# Patient Record
Sex: Female | Born: 1940
Health system: Southern US, Community
[De-identification: ages and names within clinical notes are randomized; demographics above are authoritative.]

## PROBLEM LIST (undated history)

## (undated) ENCOUNTER — Ambulatory Visit: Payer: PPO

## (undated) DIAGNOSIS — K219 Gastro-esophageal reflux disease without esophagitis: Secondary | ICD-10-CM

## (undated) DIAGNOSIS — IMO0001 Reserved for inherently not codable concepts without codable children: Secondary | ICD-10-CM

## (undated) DIAGNOSIS — R079 Chest pain, unspecified: Secondary | ICD-10-CM

## (undated) HISTORY — PX: NO PAST SURGERIES: SHX2092

## (undated) HISTORY — PX: OTHER SURGICAL HISTORY: SHX169

---

## 2001-05-16 ENCOUNTER — Other Ambulatory Visit: Admission: RE | Admit: 2001-05-16 | Discharge: 2001-05-16 | Payer: Self-pay | Admitting: General Surgery

## 2002-03-04 ENCOUNTER — Other Ambulatory Visit: Admission: RE | Admit: 2002-03-04 | Discharge: 2002-03-04 | Payer: Self-pay | Admitting: General Surgery

## 2002-03-20 ENCOUNTER — Ambulatory Visit (HOSPITAL_COMMUNITY): Admission: RE | Admit: 2002-03-20 | Discharge: 2002-03-20 | Payer: Self-pay | Admitting: General Surgery

## 2002-03-20 ENCOUNTER — Encounter: Payer: Self-pay | Admitting: General Surgery

## 2012-10-03 ENCOUNTER — Inpatient Hospital Stay (HOSPITAL_COMMUNITY)
Admission: EM | Admit: 2012-10-03 | Discharge: 2012-10-14 | DRG: 234 | Disposition: A | Discharge status: Home / Self Care | Payer: Medicare Other | Attending: Cardiothoracic Surgery | Admitting: Cardiothoracic Surgery

## 2012-10-03 ENCOUNTER — Emergency Department (HOSPITAL_COMMUNITY): Payer: Medicare Other

## 2012-10-03 ENCOUNTER — Encounter (HOSPITAL_COMMUNITY): Payer: Self-pay | Admitting: Vascular Surgery

## 2012-10-03 DIAGNOSIS — Z951 Presence of aortocoronary bypass graft: Secondary | ICD-10-CM

## 2012-10-03 DIAGNOSIS — I2510 Atherosclerotic heart disease of native coronary artery without angina pectoris: Secondary | ICD-10-CM

## 2012-10-03 DIAGNOSIS — R079 Chest pain, unspecified: Secondary | ICD-10-CM

## 2012-10-03 DIAGNOSIS — I10 Essential (primary) hypertension: Secondary | ICD-10-CM

## 2012-10-03 DIAGNOSIS — E876 Hypokalemia: Secondary | ICD-10-CM

## 2012-10-03 DIAGNOSIS — K5900 Constipation, unspecified: Secondary | ICD-10-CM | POA: Diagnosis not present

## 2012-10-03 DIAGNOSIS — I214 Non-ST elevation (NSTEMI) myocardial infarction: Principal | ICD-10-CM | POA: Diagnosis present

## 2012-10-03 DIAGNOSIS — I509 Heart failure, unspecified: Secondary | ICD-10-CM

## 2012-10-03 DIAGNOSIS — D62 Acute posthemorrhagic anemia: Secondary | ICD-10-CM | POA: Diagnosis not present

## 2012-10-03 DIAGNOSIS — E782 Mixed hyperlipidemia: Secondary | ICD-10-CM

## 2012-10-03 DIAGNOSIS — D696 Thrombocytopenia, unspecified: Secondary | ICD-10-CM | POA: Diagnosis not present

## 2012-10-03 HISTORY — DX: Reserved for inherently not codable concepts without codable children: IMO0001

## 2012-10-03 HISTORY — DX: Chest pain, unspecified: R07.9

## 2012-10-03 HISTORY — DX: Gastro-esophageal reflux disease without esophagitis: K21.9

## 2012-10-03 LAB — POCT I-STAT TROPONIN I: Troponin i, poc: 0.77 ng/mL (ref 0.00–0.08)

## 2012-10-03 MED ORDER — ASPIRIN 81 MG PO CHEW
324.0000 mg | CHEWABLE_TABLET | Freq: Once | ORAL | Status: AC
  Administered 2012-10-03: 324 mg via ORAL
  Filled 2012-10-03: qty 4

## 2012-10-03 NOTE — ED Notes (Signed)
Pt reports to  The ED via [facility]. Pts PCP was concerned because she was sitting down last night and she began having pain in her slight dull neck and jaw. Also reports a dull HA. Pt did have some dull aching in her chest following eating junk food at a party. She reports that last night she took her BP and it was high. 12 lead was unremarkable per EMS. Pt denies any pain at this time. Pt denies any associated symptoms with the episode of N/V, SOB, diaphoresis, or weakness.

## 2012-10-03 NOTE — H&P (Signed)
CARDIOLOGY ADMISSION NOTE  Patient ID: Abigail Solis MRN: [id number] DOB/AGE: 11-12-1940 72 y.o.  Admit date: 10/03/2012 Primary Physician   Dr. Catalina Pizza Primary Cardiologist   None Chief Complaint    Chest pain  HPI:  The patient has no prior cardiac history.  She reports that Tuesday night she had a dull headache. She subsequently developed some jaw and neck mild numbness and discomfort. She did develop some old chest discomfort as well. This started around 5 PM.  She thought it lasted for about 5 hours. Her husband took her blood pressure and noted it to be elevated which they thought was unusual. She was anxious and somewhat uncomfortable in a vague way and didn't sleep the rest of the night. However, she didn't have any recurrent chest discomfort. By the time she saw Dr. Margo Aye at 2:30 in the afternoon on Wednesday she had been completely pain-free and feeling well all day. However, he did cycle cardiac enzymes and a troponin came back slightly elevated. She was thus told to come to the emergency room. Here again she is pain-free. She does have an EKG with diffuse T wave inversions. Her blood pressure remains elevated. Her first point of care troponin was 0.77. She otherwise says she felt well. She goes up and down stairs routinely. She's not been exercising recently. However, with her activity she's not been getting any chest pressure, neck or arm discomfort. She's not noticed any palpitations, presyncope or syncope. She's had no shortness of breath, PND or orthopnea.   Past Medical History  Diagnosis Date  . Reflux     Past Surgical History  Procedure Date  . None     Allergies  Allergen Reactions  . Nsaids Hives and Itching    blisters   No current facility-administered medications on file prior to encounter.   Current Outpatient Prescriptions on File Prior to Encounter  Medication Sig Dispense Refill  . omeprazole (PRILOSEC) 20 MG capsule Take 20 mg by mouth daily.        History   Social History  . Marital Status: Married    Spouse Name: N/A    Number of Children: 3  . Years of Education: N/A   Occupational History  . Not on file.   Social History Main Topics  . Smoking status: Never Smoker   . Smokeless tobacco: Never Used  . Alcohol Use: No  . Drug Use: No  . Sexually Active:    Other Topics Concern  . Not on file   Social History Narrative   Lives at home with husband    Family History  Problem Relation Age of Onset  . Sudden death Father 75    ROS:  As stated in the HPI and negative for all other systems.  Physical Exam: Blood pressure 160/88, pulse 76, temperature 98.6 F (37 C), temperature source Oral, resp. rate 13, height 5' (1.524 m), weight 130 lb 8 oz (59.194 kg), SpO2 94.00%.  GENERAL:  Well appearing HEENT:  Pupils equal round and reactive, fundi not visualized, oral mucosa unremarkable NECK:  No jugular venous distention, waveform within normal limits, carotid upstroke brisk and symmetric, no bruits, no thyromegaly LYMPHATICS:  No cervical, inguinal adenopathy LUNGS:  Clear to auscultation bilaterally BACK:  No CVA tenderness CHEST:  Unremarkable HEART:  PMI not displaced or sustained,S1 and S2 within normal limits, no S3, no S4, no clicks, no rubs, no murmurs ABD:  Flat, positive bowel sounds normal in frequency in pitch, no  bruits, no rebound, no guarding, no midline pulsatile mass, no hepatomegaly, no splenomegaly EXT:  2 plus pulses throughout, no edema, no cyanosis no clubbing SKIN:  No rashes no nodules NEURO:  Cranial nerves II through XII grossly intact, motor grossly intact throughout PSYCH:  Cognitively intact, oriented to person place and time  Labs: Lab Results  Component Value Date   BUN 14 10/04/2012   Lab Results  Component Value Date   CREATININE 0.56 10/04/2012   Lab Results  Component Value Date   NA 139 10/04/2012   No results found for this basename: CKTOTAL,  CKMB,  CKMBINDEX,  TROPONINI    Lab Results  Component Value Date   WBC 4.9 10/04/2012   Lab Results  Component Value Date   CHOL 195 10/04/2012   Lab Results  Component Value Date   ALT 14 10/04/2012    Radiology:  CXR:  No acute disease  EKG:  Normal sinus rhythm, rate 79, axis within normal limits, poor anterior R wave progression, diffuse inferior and anterolateral T-wave inversions consistent with ischemia, prolonged QT, no acute ST changes.  ASSESSMENT AND PLAN:    Chest pain - Is an atypical presentation but she has a markedly abnormal EKG with elevated troponins. I don't suspect dissection or pulmonary emboli. We need to exclude obstructive coronary disease. I will admit her and start heparin and beta blockers. She will continue on aspirin which she received. I have discussed at length with her cardiac catheterization. The patient understands that risks included but are not limited to stroke (1 in 1000), death (1 in 1000), kidney failure [usually temporary] (1 in 500), bleeding (1 in 200), allergic reaction [possibly serious] (1 in 200).  The patient understands and agrees to proceed.  I will set this up in the morning. I will order an echocardiogram which I would like to be done first.  Hypertension - This is unusual. I will start her on beta blocker as above. Further management will be based on her response to this.   SignedRollene Rotunda 10/04/2012, 5:00 AM

## 2012-10-03 NOTE — ED Notes (Signed)
Critical labs reported to Dr.Pickering and Dr.Bonk

## 2012-10-04 ENCOUNTER — Other Ambulatory Visit: Payer: Self-pay | Admitting: *Deleted

## 2012-10-04 ENCOUNTER — Encounter (HOSPITAL_COMMUNITY)
Admission: EM | Disposition: A | Discharge status: Home / Self Care | Payer: Self-pay | Source: Home / Self Care | Attending: Cardiovascular Disease

## 2012-10-04 ENCOUNTER — Encounter (HOSPITAL_COMMUNITY): Payer: Self-pay | Admitting: Cardiology

## 2012-10-04 DIAGNOSIS — E782 Mixed hyperlipidemia: Secondary | ICD-10-CM

## 2012-10-04 DIAGNOSIS — I2510 Atherosclerotic heart disease of native coronary artery without angina pectoris: Secondary | ICD-10-CM

## 2012-10-04 DIAGNOSIS — I214 Non-ST elevation (NSTEMI) myocardial infarction: Secondary | ICD-10-CM | POA: Diagnosis present

## 2012-10-04 DIAGNOSIS — I10 Essential (primary) hypertension: Secondary | ICD-10-CM

## 2012-10-04 DIAGNOSIS — I509 Heart failure, unspecified: Secondary | ICD-10-CM

## 2012-10-04 DIAGNOSIS — I369 Nonrheumatic tricuspid valve disorder, unspecified: Secondary | ICD-10-CM

## 2012-10-04 HISTORY — PX: LEFT HEART CATHETERIZATION WITH CORONARY ANGIOGRAM: SHX5451

## 2012-10-04 LAB — MAGNESIUM: Magnesium: 2.1 mg/dL (ref 1.5–2.5)

## 2012-10-04 LAB — COMPREHENSIVE METABOLIC PANEL
AST: 23 U/L (ref 0–37)
Albumin: 3.4 g/dL — ABNORMAL LOW (ref 3.5–5.2)
Calcium: 9.3 mg/dL (ref 8.4–10.5)
Creatinine, Ser: 0.56 mg/dL (ref 0.50–1.10)
GFR calc non Af Amer: >90 mL/min (ref >90)

## 2012-10-04 LAB — PRO B NATRIURETIC PEPTIDE: Pro B Natriuretic peptide (BNP): 898.6 pg/mL — ABNORMAL HIGH (ref 0–125)

## 2012-10-04 LAB — LIPID PANEL
LDL Cholesterol: 126 mg/dL — ABNORMAL HIGH (ref 0–99)
Total CHOL/HDL Ratio: 3.5 RATIO

## 2012-10-04 LAB — CBC WITH DIFFERENTIAL/PLATELET
Basophils Absolute: 0 10*3/uL (ref 0.0–0.1)
Eosinophils Absolute: 0 10*3/uL (ref 0.0–0.7)
Eosinophils Relative: 1 % (ref 0–5)
MCH: 28 pg (ref 26.0–34.0)
MCHC: 31.9 g/dL (ref 30.0–36.0)
MCV: 87.8 fL (ref 78.0–100.0)
Platelets: 275 10*3/uL (ref 150–400)
RDW: 13.2 % (ref 11.5–15.5)

## 2012-10-04 LAB — HEMOGLOBIN A1C: Hgb A1c MFr Bld: 6 % — ABNORMAL HIGH (ref <5.7)

## 2012-10-04 LAB — TSH: TSH: 2.214 u[IU]/mL (ref 0.350–4.500)

## 2012-10-04 LAB — PROTIME-INR
INR: 0.99 (ref 0.00–1.49)
Prothrombin Time: 13 seconds (ref 11.6–15.2)

## 2012-10-04 SURGERY — LEFT HEART CATHETERIZATION WITH CORONARY ANGIOGRAM
Anesthesia: LOCAL

## 2012-10-04 MED ORDER — PANTOPRAZOLE SODIUM 40 MG PO TBEC
40.0000 mg | DELAYED_RELEASE_TABLET | Freq: Every day | ORAL | Status: DC
  Administered 2012-10-04 – 2012-10-08 (×5): 40 mg via ORAL
  Filled 2012-10-04 (×5): qty 1

## 2012-10-04 MED ORDER — SODIUM CHLORIDE 0.9 % IV SOLN: 250.0000 mL | INTRAVENOUS | Status: DC | PRN

## 2012-10-04 MED ORDER — NITROGLYCERIN 0.4 MG SL SUBL: 0.4000 mg | SUBLINGUAL_TABLET | SUBLINGUAL | Status: DC | PRN

## 2012-10-04 MED ORDER — SODIUM CHLORIDE 0.9 % IJ SOLN: 3.0000 mL | Freq: Two times a day (BID) | INTRAMUSCULAR | Status: DC

## 2012-10-04 MED ORDER — ASPIRIN EC 81 MG PO TBEC
81.0000 mg | DELAYED_RELEASE_TABLET | Freq: Every day | ORAL | Status: DC
  Administered 2012-10-05 – 2012-10-10 (×5): 81 mg via ORAL
  Filled 2012-10-04 (×7): qty 1

## 2012-10-04 MED ORDER — ONDANSETRON HCL 4 MG/2ML IJ SOLN: 4.0000 mg | Freq: Four times a day (QID) | INTRAMUSCULAR | Status: DC | PRN

## 2012-10-04 MED ORDER — HEPARIN BOLUS VIA INFUSION
2000.0000 [IU] | Freq: Once | INTRAVENOUS | Status: AC
  Administered 2012-10-04: 2000 [IU] via INTRAVENOUS
  Filled 2012-10-04: qty 2000

## 2012-10-04 MED ORDER — METOPROLOL TARTRATE 25 MG PO TABS
25.0000 mg | ORAL_TABLET | Freq: Two times a day (BID) | ORAL | Status: DC
  Administered 2012-10-04 – 2012-10-08 (×10): 25 mg via ORAL
  Filled 2012-10-04 (×13): qty 1

## 2012-10-04 MED ORDER — SODIUM CHLORIDE 0.9 % IJ SOLN: 3.0000 mL | INTRAMUSCULAR | Status: DC | PRN

## 2012-10-04 MED ORDER — ACETAMINOPHEN 325 MG PO TABS
650.0000 mg | ORAL_TABLET | ORAL | Status: DC | PRN
  Administered 2012-10-07: 650 mg via ORAL
  Filled 2012-10-04 (×2): qty 2

## 2012-10-04 MED ORDER — SODIUM CHLORIDE 0.9 % IV SOLN: 1.0000 mL/kg/h | INTRAVENOUS | Status: AC

## 2012-10-04 MED ORDER — FENTANYL CITRATE 0.05 MG/ML IJ SOLN
INTRAMUSCULAR | Status: AC
  Filled 2012-10-04: qty 2

## 2012-10-04 MED ORDER — LIDOCAINE HCL (PF) 1 % IJ SOLN
INTRAMUSCULAR | Status: AC
  Filled 2012-10-04: qty 30

## 2012-10-04 MED ORDER — POTASSIUM CHLORIDE CRYS ER 20 MEQ PO TBCR
20.0000 meq | EXTENDED_RELEASE_TABLET | Freq: Once | ORAL | Status: AC
  Administered 2012-10-04: 20 meq via ORAL
  Filled 2012-10-04: qty 1

## 2012-10-04 MED ORDER — HEPARIN (PORCINE) IN NACL 2-0.9 UNIT/ML-% IJ SOLN
INTRAMUSCULAR | Status: AC
  Filled 2012-10-04: qty 2000

## 2012-10-04 MED ORDER — HEPARIN (PORCINE) IN NACL 100-0.45 UNIT/ML-% IJ SOLN
700.0000 [IU]/h | INTRAMUSCULAR | Status: DC
  Administered 2012-10-04: 750 [IU]/h via INTRAVENOUS
  Administered 2012-10-05: 900 [IU]/h via INTRAVENOUS
  Administered 2012-10-06: 700 [IU]/h via INTRAVENOUS
  Filled 2012-10-04 (×5): qty 250

## 2012-10-04 MED ORDER — ATORVASTATIN CALCIUM 80 MG PO TABS
80.0000 mg | ORAL_TABLET | Freq: Every day | ORAL | Status: DC
  Administered 2012-10-04 – 2012-10-10 (×5): 80 mg via ORAL
  Filled 2012-10-04 (×8): qty 1

## 2012-10-04 MED ORDER — SODIUM CHLORIDE 0.9 % IV SOLN: INTRAVENOUS | Status: DC

## 2012-10-04 MED ORDER — HEPARIN (PORCINE) IN NACL 100-0.45 UNIT/ML-% IJ SOLN
750.0000 [IU]/h | INTRAMUSCULAR | Status: DC
  Administered 2012-10-04: 750 [IU]/h via INTRAVENOUS
  Filled 2012-10-04: qty 250

## 2012-10-04 MED ORDER — MIDAZOLAM HCL 2 MG/2ML IJ SOLN
INTRAMUSCULAR | Status: AC
  Filled 2012-10-04: qty 2

## 2012-10-04 MED ORDER — NITROGLYCERIN 0.2 MG/ML ON CALL CATH LAB
INTRAVENOUS | Status: AC
  Filled 2012-10-04: qty 1

## 2012-10-04 NOTE — Progress Notes (Signed)
  Echocardiogram 2D Echocardiogram has been performed.  Abigail Solis A 10/04/2012, 4:07 PM

## 2012-10-04 NOTE — H&P (View-Only) (Signed)
Patient ID: Abigail Solis, female   DOB: 03/01/1941, 71 y.o.   MRN: [id number]    Subjective:  Denies SSCP, palpitations or Dyspnea Anginal equivalent appears to be jaw pain  Objective:  Filed Vitals:   10/04/12 0045 10/04/12 0145 10/04/12 0252 10/04/12 0648  BP: 162/83 140/87 160/88 117/75  Pulse: 74 70 76 55  Temp:   98.6 F (37 C) 98.3 F (36.8 C)  TempSrc:   Oral Oral  Resp: 18 13 15 15  Height:   5' (1.524 m)   Weight:   130 lb 8 oz (59.194 kg)   SpO2: 98% 97% 94% 96%    Intake/Output from previous day: No intake or output data in the 24 hours ending 10/04/12 0823  Physical Exam: Affect appropriate Healthy:  appears stated age HEENT: normal Neck supple with no adenopathy JVP normal no bruits no thyromegaly Lungs clear with no wheezing and good diaphragmatic motion Heart:  S1/S2 no murmur, no rub, gallop or click PMI normal Abdomen: benighn, BS positve, no tenderness, no AAA no bruit.  No HSM or HJR Distal pulses intact with no bruits No edema Neuro non-focal Skin warm and dry No muscular weakness   Lab Results: Basic Metabolic Panel:  Basename 10/04/12 0244  NA 139  K 3.4*  CL 104  CO2 24  GLUCOSE 98  BUN 14  CREATININE 0.56  CALCIUM 9.3  MG 2.1  PHOS --   Liver Function Tests:  Basename 10/04/12 0244  AST 23  ALT 14  ALKPHOS 56  BILITOT 0.3  PROT 9.3*  ALBUMIN 3.4*   No results found for this basename: LIPASE:2,AMYLASE:2 in the last 72 hours CBC:  Basename 10/04/12 0244  WBC 4.9  NEUTROABS 2.7  HGB 11.7*  HCT 36.7  MCV 87.8  PLT 275   Cardiac Enzymes:  Basename 10/04/12 0244  CKTOTAL --  CKMB --  CKMBINDEX --  TROPONINI 0.96*     Basename 10/04/12 0300  CHOL 195  HDL 56  LDLCALC 126*  TRIG 65  CHOLHDL 3.5  LDLDIRECT --    Imaging: Dg Chest 2 View  10/03/2012  *RADIOLOGY REPORT*  Clinical Data: Hypertension and tachycardia.  CHEST - 2 VIEW  Comparison: None.  Findings: Lungs are clear.  Heart size is normal.  No  pneumothorax or pleural fluid.  IMPRESSION: No acute disease.   Original Report Authenticated By: Thomas D'Alessio, M.D.     Cardiac Studies:  ECG:  SR anterolateral T wave inversions no ST elevation   Telemetry:  NSR no VT  10/04/2012   Echo:   Medications:     . aspirin EC  81 mg Oral Daily  . atorvastatin  80 mg Oral q1800  . metoprolol tartrate  25 mg Oral BID  . pantoprazole  40 mg Oral Daily  . potassium chloride  20 mEq Oral Once  . sodium chloride  3 mL Intravenous Q12H  . sodium chloride  3 mL Intravenous Q12H       . sodium chloride    . heparin 750 Units/hr (10/04/12 0339)    Assessment/Plan:  CHF:  Mild increase in BNP  Check LV gram and EDP  Hopefully more diatstolic from ischemia and LV function Not down.   HTN:  Add ACE Chol:  Continue statin SEMI:  Discussed likely nature of LAD or multivessel disease.  Willing to proceed with cath today with Dr Jordan Continue beta blocker and heparin  Keegen Heffern 10/04/2012, 8:23 AM     

## 2012-10-04 NOTE — ED Provider Notes (Signed)
History     CSN: [id number]  Arrival date & time 10/03/12  2209   First MD Initiated Contact with Patient 10/03/12 2305      Chief Complaint  Patient presents with  . Chest Pain    (Consider location/radiation/quality/duration/timing/severity/associated sxs/prior treatment) HPIScarlet S Solis is a 72 y.o. female presents via EMS for some indigestion and neck pain yesterday.  Yesterday, patient was playing cards with friends and ate some "junk food" and then had some subsequent indigestion when she returned home from her card playing. This was associated with some dull, aching epigastric pain that did not radiate was not associated with diaphoresis radiation to the arms, or vomiting or nausea. She did also notice some dull aching pain in the front of her neck which she's not had before.  She took her blood pressure at home and noted it was elevated in the 160s 150 systolic, and so stayed up all night the as she was worried. She saw her primary care physician today who did an EKG and advised her to come to the ER. She is currently symptom-free. She denies any current chest pain, shortness of breath, orthopnea, fevers, chills, cough. She has had a recent sore throat but has gotten over that about a week ago. No current headaches, rhinorrhea, coryza, joint swelling, lower extremity swelling, rash.   Past Medical History  Diagnosis Date  . Reflux     Past Surgical History  Procedure Date  . None     Family History  Problem Relation Age of Onset  . Sudden death Father 23    History  Substance Use Topics  . Smoking status: Never Smoker   . Smokeless tobacco: Never Used  . Alcohol Use: No    OB History    Grav Para Term Preterm Abortions TAB SAB Ect Mult Living                  Review of Systems At least 10pt or greater review of systems completed and are negative except where specified in the HPI.  Allergies  Nsaids  Home Medications   Current Outpatient Rx  Name   Route  Sig  Dispense  Refill  . OMEPRAZOLE 20 MG PO CPDR   Oral   Take 20 mg by mouth daily.           BP 157/90  Pulse 74  Temp 98.2 F (36.8 C) (Oral)  Resp 21  SpO2 99%  Physical Exam  Nursing notes reviewed.  Electronic medical record reviewed. VITAL SIGNS:   Filed Vitals:   10/03/12 2315 10/03/12 2330 10/04/12 0000 10/04/12 0030  BP: 170/92 159/74 173/93 157/90  Pulse: 86 78 81 74  Temp:      TempSrc:      Resp: 17 12 15 21   SpO2: 98% 99% 98% 99%   CONSTITUTIONAL: Awake, oriented, appears non-toxic HENT: Atraumatic, normocephalic, oral mucosa pink and moist, airway patent. Nares patent without drainage. External ears normal. EYES: Conjunctiva clear, EOMI, PERRLA NECK: Trachea midline, non-tender, supple CARDIOVASCULAR: Normal heart rate, Normal rhythm, No murmurs, rubs, gallops PULMONARY/CHEST: Clear to auscultation, no rhonchi, wheezes, or rales. Symmetrical breath sounds. Non-tender. ABDOMINAL: Non-distended, soft, non-tender - no rebound or guarding.  BS normal. NEUROLOGIC: Non-focal, moving all four extremities, no gross sensory or motor deficits. EXTREMITIES: No clubbing, cyanosis, or edema SKIN: Warm, Dry, No erythema, No rash  ED Course  Procedures (including critical care time)  Labs Reviewed  POCT I-STAT TROPONIN I - Abnormal; Notable  for the following:    Troponin i, poc 0.77 (*)     All other components within normal limits   Dg Chest 2 View  10/03/2012  *RADIOLOGY REPORT*  Clinical Data: Hypertension and tachycardia.  CHEST - 2 VIEW  Comparison: None.  Findings: Lungs are clear.  Heart size is normal.  No pneumothorax or pleural fluid.  IMPRESSION: No acute disease.   Original Report Authenticated By: Holley Dexter, M.D.      1. NSTEMI (non-ST elevated myocardial infarction)   2. HTN (hypertension)     Medications  omeprazole (PRILOSEC) 20 MG capsule (not administered)  aspirin chewable tablet 324 mg (324 mg Oral Given 10/03/12 2319)     MDM  Abigail Solis is a 72 y.o. female presenting as she was referred by her primary care physician Dr. Margo Aye, Dr. Margo Aye has spoken with Dr. Antoine Poche already. Initial EKG shows diffuse T wave abnormalities including inverted T waves in leads 2, 3, aVF and V2 through V6. Her non-ST elevations or depressions. Troponin is elevated at 0.77. Patient has been given 324 mg of aspirin. Discussed with Dr. Antoine Poche for admission for likely NSTEMI.          Jones Skene, MD 10/04/12 4098

## 2012-10-04 NOTE — Progress Notes (Signed)
CRITICAL VALUE ALERT  Critical value received: Troponin 0.96   Date of notification:  10/04/2012  Time of notification:  0437  Critical value read back:yes  Nurse who received alert:  Jodene Nam RN  MD notified (1st page):  Hochrein MD  Time of first page:  0445  Responding MD:  Hochrein MD  Time MD responded: [phone]  No new orders, will continue to monitor.

## 2012-10-04 NOTE — Interval H&P Note (Signed)
History and Physical Interval Note:  10/04/2012 11:42 AM  Abigail Solis  has presented today for surgery, with the diagnosis of cp  The various methods of treatment have been discussed with the patient and family. After consideration of risks, benefits and other options for treatment, the patient has consented to  Procedure(s) (LRB) with comments: LEFT HEART CATHETERIZATION WITH CORONARY ANGIOGRAM (N/A) as a surgical intervention .  The patient's history has been reviewed, patient examined, no change in status, stable for surgery.  I have reviewed the patient's chart and labs.  Questions were answered to the patient's satisfaction.     Theron Arista [facility] 10/04/2012 11:42 AM

## 2012-10-04 NOTE — CV Procedure (Signed)
   Cardiac Catheterization Procedure Note  Name: Abigail Solis MRN: [id number] DOB: 07-18-1941  Procedure: Left Heart Cath, Selective Coronary Angiography, LV angiography  Indication: 72 year old white female who presents with a non-ST elevation myocardial infarction. ECG shows diffuse T-wave inversion and troponins are abnormal.   Procedural Details: The right wrist was prepped, draped, and anesthetized with 1% lidocaine. Using the modified Seldinger technique, a 5 French sheath was introduced into the right radial artery. 3 mg of verapamil was administered through the sheath, weight-based unfractionated heparin was administered intravenously. Standard Judkins catheters were used for selective coronary angiography and left ventriculography. Catheter exchanges were performed over an exchange length guidewire. There were no immediate procedural complications. A TR band was used for radial hemostasis at the completion of the procedure.  The patient was transferred to the post catheterization recovery area for further monitoring.  Procedural Findings: Hemodynamics: AO 116/59 with a mean of 83 mmHg LV  126/11 mmHg  Coronary angiography: Coronary dominance: right  Left mainstem:  There is mild 20% narrowing at the ostium.  Left anterior descending (LAD):  The LAD has an 80% stenosis immediately after the takeoff of the first diagonal followed by 70% stenosis in the mid vessel. The first diagonal is a large branch which has a 90% stenosis proximally. The segment of disease does extend to the ostium.  Left circumflex (LCx):  The left circumflex is a large vessel. It gives rise to 3 marginal branches before terminating on the posterior lateral wall. There is 50% stenosis in the mid circumflex between the first and second marginal branches. There is 30% disease distal to the second OM.  Right coronary artery (RCA):  The right coronary is a codominant vessel. It has a 90-95% stenosis in the  proximal to mid vessel. This is a segmental stenosis.  Left ventriculography: Left ventricular systolic function is normal, LVEF is estimated at 6065%,  there is a small focal area of inferior apical hypokinesis,there is no significant mitral regurgitation   Final Conclusions:   1. Severe two-vessel obstructive coronary disease. 2. Well-preserved left ventricular function.  Recommendations:  The right coronary would be suitable for percutaneous intervention. The difficulty is the bifurcation disease in the LAD and large diagonal branch. Given this the patient may be best served with revascularization with coronary bypass surgery. We will discuss these options with the patient and her family.  Theron Arista [facility] 10/04/2012, 12:23 PM

## 2012-10-04 NOTE — ED Notes (Signed)
Pt transported to floor with tech. Pt stable. No signs of distress noted

## 2012-10-04 NOTE — Progress Notes (Signed)
ANTICOAGULATION CONSULT NOTE - Initial Consult  Pharmacy Consult for heparin Indication: chest pain/ACS  Allergies  Allergen Reactions  . Nsaids Hives and Itching    blisters    Patient Measurements: Height: 5' (152.4 cm) Weight: 130 lb 8 oz (59.194 kg) IBW/kg (Calculated) : 45.5   Vital Signs: Temp: 98.6 F (37 C) (01/09 0252) Temp src: Oral (01/09 0252) BP: 160/88 mmHg (01/09 0252) Pulse Rate: 76  (01/09 0252)   Medical History: Past Medical History  Diagnosis Date  . Reflux     Medications:  Prescriptions prior to admission  Medication Sig Dispense Refill  . omeprazole (PRILOSEC) 20 MG capsule Take 20 mg by mouth daily.       Scheduled:    . [COMPLETED] aspirin  324 mg Oral Once  . aspirin EC  81 mg Oral Daily  . atorvastatin  80 mg Oral q1800  . heparin  2,000 Units Intravenous Once  . metoprolol tartrate  25 mg Oral BID  . pantoprazole  40 mg Oral Daily  . sodium chloride  3 mL Intravenous Q12H  . sodium chloride  3 mL Intravenous Q12H    Assessment: 72yo female c/o indigestion and neck pain, saw PCP who did EKG and advised to go to ED, presented to [facility] where i-stat troponin was elevated, now tx'd to [facility], to begin heparin.  Goal of Therapy:  Heparin level 0.3-0.7 units/ml Monitor platelets by anticoagulation protocol: Yes   Plan:  Will give heparin 2000 units IV bolus x1 followed by gtt at 750 units/hr and monitor heparin levels and CBC.  Colleen Can PharmD BCPS 10/04/2012,3:01 AM

## 2012-10-04 NOTE — Progress Notes (Signed)
Patient ID: Abigail Solis, female   DOB: 1940/10/01, 72 y.o.   MRN: [id number]    Subjective:  Denies SSCP, palpitations or Dyspnea Anginal equivalent appears to be jaw pain  Objective:  Filed Vitals:   10/04/12 0045 10/04/12 0145 10/04/12 0252 10/04/12 0648  BP: 162/83 140/87 160/88 117/75  Pulse: 74 70 76 55  Temp:   98.6 F (37 C) 98.3 F (36.8 C)  TempSrc:   Oral Oral  Resp: 18 13 15 15   Height:   5' (1.524 m)   Weight:   130 lb 8 oz (59.194 kg)   SpO2: 98% 97% 94% 96%    Intake/Output from previous day: No intake or output data in the 24 hours ending 10/04/12 4098  Physical Exam: Affect appropriate Healthy:  appears stated age HEENT: normal Neck supple with no adenopathy JVP normal no bruits no thyromegaly Lungs clear with no wheezing and good diaphragmatic motion Heart:  S1/S2 no murmur, no rub, gallop or click PMI normal Abdomen: benighn, BS positve, no tenderness, no AAA no bruit.  No HSM or HJR Distal pulses intact with no bruits No edema Neuro non-focal Skin warm and dry No muscular weakness   Lab Results: Basic Metabolic Panel:  Basename 10/04/12 0244  NA 139  K 3.4*  CL 104  CO2 24  GLUCOSE 98  BUN 14  CREATININE 0.56  CALCIUM 9.3  MG 2.1  PHOS --   Liver Function Tests:  Basename 10/04/12 0244  AST 23  ALT 14  ALKPHOS 56  BILITOT 0.3  PROT 9.3*  ALBUMIN 3.4*   No results found for this basename: LIPASE:2,AMYLASE:2 in the last 72 hours CBC:  Basename 10/04/12 0244  WBC 4.9  NEUTROABS 2.7  HGB 11.7*  HCT 36.7  MCV 87.8  PLT 275   Cardiac Enzymes:  Basename 10/04/12 0244  CKTOTAL --  CKMB --  CKMBINDEX --  TROPONINI 0.96*     Basename 10/04/12 0300  CHOL 195  HDL 56  LDLCALC 126*  TRIG 65  CHOLHDL 3.5  LDLDIRECT --    Imaging: Dg Chest 2 View  10/03/2012  *RADIOLOGY REPORT*  Clinical Data: Hypertension and tachycardia.  CHEST - 2 VIEW  Comparison: None.  Findings: Lungs are clear.  Heart size is normal.  No  pneumothorax or pleural fluid.  IMPRESSION: No acute disease.   Original Report Authenticated By: Holley Dexter, M.D.     Cardiac Studies:  ECG:  SR anterolateral T wave inversions no ST elevation   Telemetry:  NSR no VT  10/04/2012   Echo:   Medications:     . aspirin EC  81 mg Oral Daily  . atorvastatin  80 mg Oral q1800  . metoprolol tartrate  25 mg Oral BID  . pantoprazole  40 mg Oral Daily  . potassium chloride  20 mEq Oral Once  . sodium chloride  3 mL Intravenous Q12H  . sodium chloride  3 mL Intravenous Q12H       . sodium chloride    . heparin 750 Units/hr (10/04/12 0339)    Assessment/Plan:  CHF:  Mild increase in BNP  Check LV gram and EDP  Hopefully more diatstolic from ischemia and LV function Not down.   HTN:  Add ACE Chol:  Continue statin SEMI:  Discussed likely nature of LAD or multivessel disease.  Willing to proceed with cath today with Dr [name] Continue beta blocker and heparin  Charlton Haws 10/04/2012, 8:23 AM

## 2012-10-04 NOTE — Progress Notes (Addendum)
ANTICOAGULATION CONSULT NOTE - Initial Consult  Pharmacy Consult for heparin Indication: chest pain/ACS, 2 V CAD  Allergies  Allergen Reactions  . Nsaids Hives and Itching    blisters    Patient Measurements: Height: 5' (152.4 cm) Weight: 130 lb 8 oz (59.194 kg) IBW/kg (Calculated) : 45.5   Vital Signs: Temp: 98.3 F (36.8 C) (01/09 0648) Temp src: Oral (01/09 0648) BP: 117/75 mmHg (01/09 0648) Pulse Rate: 67  (01/09 1148)   Medical History: Past Medical History  Diagnosis Date  . Reflux   . Chest pain 10/03/2012  . GERD (gastroesophageal reflux disease)     Medications:  Prescriptions prior to admission  Medication Sig Dispense Refill  . omeprazole (PRILOSEC) 20 MG capsule Take 20 mg by mouth daily.       Scheduled:     . [COMPLETED] aspirin  324 mg Oral Once  . aspirin EC  81 mg Oral Daily  . atorvastatin  80 mg Oral q1800  . [COMPLETED] fentaNYL      . [COMPLETED] heparin      . [COMPLETED] heparin  2,000 Units Intravenous Once  . [COMPLETED] lidocaine      . metoprolol tartrate  25 mg Oral BID  . [COMPLETED] midazolam      . [COMPLETED] nitroGLYCERIN      . pantoprazole  40 mg Oral Daily  . [COMPLETED] potassium chloride  20 mEq Oral Once  . [DISCONTINUED] sodium chloride  3 mL Intravenous Q12H  . [DISCONTINUED] sodium chloride  3 mL Intravenous Q12H    Assessment: 72yo female c/o indigestion and neck pain, saw PCP who did EKG and advised to go to ED.  Now s/p cath lab which revealed 2V CAD.  Pharmacy asked to resume anticoagulation with IV heparin 8 hrs after sheath removed (removed ~ 1430 PM).  Goal of Therapy:  Heparin level 0.3-0.7 units/ml Monitor platelets by anticoagulation protocol: Yes   Plan:  Resume heparin at previous rate of 750 units/hr at 2230 PM. Check heparin level 6 hrs after gtt restarted. Continue daily heparin level and CBC.  Gardner Candle PharmD BCPS 10/04/2012,3:27 PM

## 2012-10-04 NOTE — Progress Notes (Signed)
Utilization review completed.  

## 2012-10-05 ENCOUNTER — Inpatient Hospital Stay (HOSPITAL_COMMUNITY): Payer: Medicare Other

## 2012-10-05 DIAGNOSIS — I2510 Atherosclerotic heart disease of native coronary artery without angina pectoris: Secondary | ICD-10-CM

## 2012-10-05 DIAGNOSIS — Z01810 Encounter for preprocedural cardiovascular examination: Secondary | ICD-10-CM

## 2012-10-05 DIAGNOSIS — I214 Non-ST elevation (NSTEMI) myocardial infarction: Secondary | ICD-10-CM

## 2012-10-05 LAB — PULMONARY FUNCTION TEST

## 2012-10-05 LAB — HEPARIN LEVEL (UNFRACTIONATED)
Heparin Unfractionated: 0.15 IU/mL — ABNORMAL LOW (ref 0.30–0.70)
Heparin Unfractionated: 0.65 IU/mL (ref 0.30–0.70)

## 2012-10-05 LAB — CBC
MCH: 29.3 pg (ref 26.0–34.0)
MCHC: 32.7 g/dL (ref 30.0–36.0)
Platelets: 262 10*3/uL (ref 150–400)
RBC: 4 MIL/uL (ref 3.87–5.11)

## 2012-10-05 MED ORDER — ALPRAZOLAM 0.25 MG PO TABS
0.2500 mg | ORAL_TABLET | Freq: Three times a day (TID) | ORAL | Status: DC | PRN
  Administered 2012-10-05 – 2012-10-09 (×5): 0.25 mg via ORAL
  Filled 2012-10-05 (×5): qty 1

## 2012-10-05 NOTE — Progress Notes (Signed)
ANTICOAGULATION CONSULT NOTE  Pharmacy Consult for heparin Indication: chest pain/ACS, 2 V CAD  Allergies  Allergen Reactions  . Nsaids Hives and Itching    blisters    Patient Measurements: Height: 5' (152.4 cm) Weight: 130 lb 8 oz (59.194 kg) IBW/kg (Calculated) : 45.5   Vital Signs: Temp: 98.2 F (36.8 C) (01/09 2110) Temp src: Oral (01/09 2110) BP: 115/70 mmHg (01/09 2110) Pulse Rate: 65  (01/09 2110)   Medical History: Past Medical History  Diagnosis Date  . Reflux   . Chest pain 10/03/2012  . GERD (gastroesophageal reflux disease)    Assessment: 72yo female with CAD awaiting possible CABG for Heparin  Goal of Therapy:  Heparin level 0.3-0.7 units/ml Monitor platelets by anticoagulation protocol: Yes   Plan:  Increase Heparin 900 units/hr Check heparin level in 8 hours.  Abigail Solis, Gary Fleet PharmD BCPS 10/05/2012,6:13 AM

## 2012-10-05 NOTE — Progress Notes (Addendum)
Pre-op Cardiac Surgery  Carotid Findings:  Bilateral ICA: no stenosis. Bilateral Vertebral: antegrade flow. Incidental finding: There appears to be a branch coming from the Left IJV that is partially thrombosed.   Upper Extremity Right Left  Brachial Pressures 135 T 141 T  Radial Waveforms T T  Ulnar Waveforms T T  Palmar Arch (Allen's Test) Normal with radial compression. Obliterates with ulnar compression.  Decreases greater than 50% with radial compression. Obliterates with ulnar compression.    Findings:      Lower  Extremity Right Left  Dorsalis Pedis    Anterior Tibial    Posterior Tibial    Ankle/Brachial Indices      Findings:  Palpable pulses X 4.

## 2012-10-05 NOTE — Clinical Documentation Improvement (Signed)
CHF DOCUMENTATION CLARIFICATION QUERY  THIS DOCUMENT IS NOT A PERMANENT PART OF THE MEDICAL RECORD  TO RESPOND TO THE THIS QUERY, FOLLOW THE INSTRUCTIONS BELOW:  1. If needed, update documentation for the patient's encounter via the notes activity.  2. Access this query again and click edit on the In [employer].  3. After updating, or not, click F2 to complete all highlighted (required) fields concerning your review. Select "additional documentation in the medical record" OR "no additional documentation provided".  4. Click Sign note button.  5. The deficiency will fall out of your In Basket *Please let us know if you are not able to complete this workflow by phone or e-mail (listed below).  Please update your documentation within the medical record to reflect your response to this query.                                                                                    10/05/12  Dear Dr. Eden Emms  / Associates,  In a better effort to capture your patient's severity of illness/SOI, risk of mortality/ROM, reflect appropriate length of stay and utilization of resources, a review of the patient medical record has revealed the following indicators the diagnosis of Heart Failure.   NOTED ON 1/9 "CHF". PLEASE CLARIFY IN NOTES/DC SUMMARY SUSPECTED TYPE AND ACUITY TO MORE CLEARLY DEFINE YOUR PATIENT'S SEVERITY OF ILLNESS/RISK OF MORTALITY. THANK YOU.  Possible Clinical Conditions? Marland Kitchen Acute Systolic Congestive Heart Failure . Acute  Diastolic Congestive Heart Failure . Acute  Systolic  & Diastolic Congestive Heart Failure . Acute on Chronic Systolic Congestive Heart Failure . Acute  on Chronic Diastolic Congestive Heart Failure . Acute  on Chronic Systolic  & Diastolic Congestive Heart Failure . Other Condition   Supporting Information: - Risk Factors: Acute SEMI, HTN - Per PN 1/9:"CHF:  Mild increase in BNP  Check LV gram and EDP  Hopefully more diatstolic from ischemia and LV function" -  BNP: 1/9: 898.6 - EF:  60-65% - Treatment: Cath, O2, pulse ox, Poss CABG 1/14 - Diuretics: none   Reviewed: additional documentation in the medical record  Thank You,  Beverley Fiedler RN BSN Clinical Documentation Specialist: Tele:  [phone] Health Information Management [place]

## 2012-10-05 NOTE — Progress Notes (Signed)
   Patient ID: Abigail Solis, female   DOB: Apr 15, 1941, 72 y.o.   MRN: [id number]    Subjective:  Denies SSCP, palpitations or Dyspnea Anginal equivalent appears to be jaw pain  Objective:  Filed Vitals:   10/04/12 0648 10/04/12 1148 10/04/12 2110 10/05/12 0622  BP: 117/75  115/70 137/81  Pulse: 55 67 65 65  Temp: 98.3 F (36.8 C)  98.2 F (36.8 C) 98.8 F (37.1 C)  TempSrc: Oral  Oral Oral  Resp: 15  16 17   Height:      Weight:    130 lb 1.1 oz (59 kg)  SpO2: 96%  93% 95%    Intake/Output from previous day: No intake or output data in the 24 hours ending 10/05/12 0825  Physical Exam: Affect appropriate Healthy:  appears stated age HEENT: normal Neck supple with no adenopathy JVP normal no bruits no thyromegaly Lungs clear with no wheezing and good diaphragmatic motion Heart:  S1/S2 no murmur, no rub, gallop or click PMI normal Abdomen: benighn, BS positve, no tenderness, no AAA no bruit.  No HSM or HJR Distal pulses intact with no bruits No edema Neuro non-focal Skin warm and dry No muscular weakness   Lab Results: Basic Metabolic Panel:  Basename 10/04/12 0244  NA 139  K 3.4*  CL 104  CO2 24  GLUCOSE 98  BUN 14  CREATININE 0.56  CALCIUM 9.3  MG 2.1  PHOS --   Liver Function Tests:  [facility] 10/04/12 0244  AST 23  ALT 14  ALKPHOS 56  BILITOT 0.3  PROT 9.3*  ALBUMIN 3.4*   CBC:  Basename 10/05/12 0500 10/04/12 0244  WBC 6.0 4.9  NEUTROABS -- 2.7  HGB 11.7* 11.7*  HCT 35.8* 36.7  MCV 89.5 87.8  PLT 262 275   Cardiac Enzymes:  Basename 10/04/12 0759 10/04/12 0244  CKTOTAL -- --  CKMB -- --  CKMBINDEX -- --  TROPONINI 0.85* 0.96*     Basename 10/04/12 0300  CHOL 195  HDL 56  LDLCALC 126*  TRIG 65  CHOLHDL 3.5  LDLDIRECT --    Imaging: Dg Chest 2 View  10/03/2012  *RADIOLOGY REPORT*  Clinical Data: Hypertension and tachycardia.  CHEST - 2 VIEW  Comparison: None.  Findings: Lungs are clear.  Heart size is normal.  No  pneumothorax or pleural fluid.  IMPRESSION: No acute disease.   Original Report Authenticated By: Holley Dexter, M.D.     Cardiac Studies:  ECG:  SR anterolateral T wave inversions no ST elevation   Telemetry:  NSR no VT  10/05/2012   Echo: EF 55-60%  No valve disease  Medications:      . aspirin EC  81 mg Oral Daily  . atorvastatin  80 mg Oral q1800  . metoprolol tartrate  25 mg Oral BID  . pantoprazole  40 mg Oral Daily        . heparin 900 Units/hr (10/05/12 4098)    Assessment/Plan:  CAD:  Reviewed films and agree with Dr [name] that CABG is her best long term option.  She is scared.  Dr Tyrone Sage can operate on Tuesday Would keep in hospital given SEMI.  EF preserved pre CABG dopplers pending.   Continue asa beta blocker and heparin Cholesterol :  Continue statin probably decrease to 40 mg on D/C  Charlton Haws 10/05/2012, 8:25 AM

## 2012-10-05 NOTE — Progress Notes (Addendum)
Patient ID: Abigail Solis, female   DOB: 27-Sep-1940, 72 y.o.   MRN: [id number]                    [address]            [place] [address]          [phone]       ZENDAYA GROSECLOSE [facility] Medical Record #[id number] Date of Birth: 1940/12/24  Referring: Dr Myna Hidalgo, Primary Cardiologist Dr Estrella Myrtle Primary Care: Dwana Melena, MD  Chief Complaint:    Chief Complaint  Patient presents with  . Chest Pain    History of Present Illness:     Abigail Solis is a 72 yo with no known history of Coronary Artery Disease.  The patient developed a headache Tuesday evening that transitioned into some jaw and mild neck discomfort.  The patient also noted some chest discomfort as well.  She stated this started around 5 pm and last around 5 hours.  Her husband took her her blood pressure which was elevated.  The patient did not sleep much that night.  The patients chest pain did not return.  She was evaluated by Dr. Margo Aye at the office on Wednesday.  He obtained cardiac enzymes which were slightly elevated.  Therefore she was referred to the Emergency Department for further workup.  Upon arrival EKG was obtained and showed diffuse T wave inversions.  The patient denied chest pain at that time.  Due to the elevated Cardiac enzymes and EKG changes it was felt the patient should be admitted for evaluation for possible CAD.  She was placed on Heparin and NTG.  Her cardiac enzymes continued to rise.  She was taken for cardiac catheterization on 10/04/2012 and found to have a preserved EF of 60-65% and severe 2 vessel obstructive coronary disease.  It was felt the patient would benefit from coronary re-vascularization by coronary bypass surgery and TCTS was consulted.  The patient is currently chest pain free and denies shortness of breath.            Current Activity/ Functional Status: Patient is independent with mobility/ambulation, transfers, ADL's, IADL's.   Past Medical History    Diagnosis Date  . Reflux   . Chest pain 10/03/2012  . GERD (gastroesophageal reflux disease)     Past Surgical History  Procedure Date  . None   . No past surgeries     History  Smoking status  . Never Smoker   Smokeless tobacco  . Never Used    History  Alcohol Use No    History   Social History  . Marital Status: Married    Spouse Name: N/A    Number of Children: 3  . Years of Education: N/A    Social History Main Topics  . Smoking status: Never Smoker   . Smokeless tobacco: Never Used  . Alcohol Use: No  . Drug Use: No  .            Social History Narrative   Lives at home with husband    Allergies  Allergen Reactions  . Nsaids Hives and Itching    blisters    Current Facility-Administered Medications  Medication Dose Route Frequency Provider Last Rate Last Dose  . acetaminophen (TYLENOL) tablet 650 mg  650 mg Oral Q4H PRN Rollene Rotunda, MD      . ALPRAZolam Prudy Feeler) tablet 0.25 mg  0.25 mg Oral TID PRN Alcario Drought  Brion Aliment, NP   0.25 mg at 10/05/12 1118  . aspirin EC tablet 81 mg  81 mg Oral Daily Rollene Rotunda, MD   81 mg at 10/05/12 1118  . atorvastatin (LIPITOR) tablet 80 mg  80 mg Oral q1800 Rollene Rotunda, MD   80 mg at 10/04/12 2106  . heparin ADULT infusion 100 units/mL (25000 units/250 mL)  900 Units/hr Intravenous Continuous Wendall Stade, MD 9 mL/hr at 10/05/12 0621 900 Units/hr at 10/05/12 0621  . metoprolol tartrate (LOPRESSOR) tablet 25 mg  25 mg Oral BID Rollene Rotunda, MD   25 mg at 10/05/12 1118  . nitroGLYCERIN (NITROSTAT) SL tablet 0.4 mg  0.4 mg Sublingual Q5 Min x 3 PRN Rollene Rotunda, MD      . ondansetron [facility]) injection 4 mg  4 mg Intravenous Q6H PRN Rollene Rotunda, MD      . pantoprazole (PROTONIX) EC tablet 40 mg  40 mg Oral Daily Rollene Rotunda, MD   40 mg at 10/05/12 1118    Prescriptions prior to admission  Medication Sig Dispense Refill  . omeprazole (PRILOSEC) 20 MG capsule Take 20 mg by mouth daily.         Family History  Problem Relation Age of Onset  . Sudden death Father 72     Review of Systems:     Cardiac Review of Systems: Y or N  Chest Pain [  y]  Resting SOB [  n ] Exertional SOB  [ y ]  Pollyann Kennedy Milo.Brash ]   Pedal Edema [ n  ]    Palpitations [ n ] Syncope  [ n ]   Presyncope [n   ]  General Review of Systems: [Y] = yes [  ]=no Constitional: recent weight change [  n]; anorexia [n ]; fatigue [ 6 months ]; nausea [  n]; night sweats [  ]n; fever [ n ]; or chills [n ];                                                                                                                                          Dental: poor dentition[ n ]; Last Dentist visit: recent  Eye : blurred vision [  ]; diplopia [   ]; vision changes [  ];  Amaurosis fugax[  ]; Resp: cough [ n ];  wheezing[ n ];  hemoptysis[  n]; shortness of breath[n  ]; paroxysmal nocturnal dyspnea[n  ]; dyspnea on exertion[ n ]; or orthopnea[ n ];  GI:  gallstones[  ], vomiting[  ];  dysphagia[  ]; melena[ n ];  hematochezia [ n ]; heartburn[  n;   Hx of  Colonoscopy[ y ]; GU: kidney stones [  ]; hematuria[  ];   dysuria [  ];  nocturia[  ];  history of     obstruction [  ];  Skin: rash, swelling[  ];, hair loss[  ];  peripheral edema[  ];  or itching[  ]; Musculosketetal: myalgias[  ];  joint swelling[  ];  joint erythema[  ];  joint pain[  ];  back pain[  ];  Heme/Lymph: bruising[  ];  bleeding[  ];  anemia[  ];  Neuro: TIA[  ];  headaches[  ];  stroke[  ];  vertigo[  ];  seizures[  ];   paresthesias[ n ];  difficulty walking[n  ];  Psych:depression[  ]; anxiety[  ];  Endocrine: diabetes[  ];  thyroid dysfunction[  ];  Immunizations: Flu [n]; Pneumococcal[ n ];  Other:  Physical Exam: BP 107/70  Pulse 60  Temp 97.5 F (36.4 C) (Oral)  Resp 20  Ht 5' (1.524 m)  Wt 130 lb 1.1 oz (59 kg)  BMI 25.40 kg/m2  SpO2 95%  General appearance: alert, cooperative and appears stated age Neurologic: intact Heart:  regular rate and rhythm, S1, S2 normal, no murmur, click, rub or gallop and normal apical impulse Lungs: clear to auscultation bilaterally and normal percussion bilaterally Abdomen: soft, non-tender; bowel sounds normal; no masses,  no organomegaly Extremities: extremities normal, atraumatic, no cyanosis or edema, Homans sign is negative, no sign of DVT and rt radial cath site ok    Diagnostic Studies & Laboratory data:     Recent Radiology Findings:   Dg Chest 2 View  10/03/2012  *RADIOLOGY REPORT*  Clinical Data: Hypertension and tachycardia.  CHEST - 2 VIEW  Comparison: None.  Findings: Lungs are clear.  Heart size is normal.  No pneumothorax or pleural fluid.  IMPRESSION: No acute disease.   Original Report Authenticated By: Holley Dexter, M.D.       Recent Lab Findings: Lab Results  Component Value Date   WBC 6.0 10/05/2012   HGB 11.7* 10/05/2012   HCT 35.8* 10/05/2012   PLT 262 10/05/2012   GLUCOSE 98 10/04/2012   CHOL 195 10/04/2012   TRIG 65 10/04/2012   HDL 56 10/04/2012   LDLCALC 161* 10/04/2012   ALT 14 10/04/2012   AST 23 10/04/2012   NA 139 10/04/2012   K 3.4* 10/04/2012   CL 104 10/04/2012   CREATININE 0.56 10/04/2012   BUN 14 10/04/2012   CO2 24 10/04/2012   TSH 2.214 10/04/2012   INR 0.99 10/04/2012   HGBA1C 6.0* 10/04/2012   ECHO: [facility]* *[facility]* [address] [place], [place] [address] [phone]  ------------------------------------------------------------ Transthoracic Echocardiography  Patient: Abigail Solis, Abigail Solis MR #: [id number] Study Date: 10/04/2012 Gender: F Age: 60 Height: 152.4cm Weight: 59.2kg BSA: 1.25m^2 Pt. Status: Room: 3W05C  PERFORMING [facility] Rollene Rotunda SONOGRAPHER Mathews Argyle, RDCS, ARDMS cc:  ------------------------------------------------------------ LV EF: 55% - 60%  ------------------------------------------------------------ History: PMH: NSTEMI Congestive heart failure. Risk factors:  Hypertension.  ------------------------------------------------------------ Study Conclusions  - Left ventricle: The cavity size was normal. Wall thickness was normal. Systolic function was normal. The estimated ejection fraction was in the range of 55% to 60%. Regional wall motion abnormalities cannot be excluded. Doppler parameters are consistent with abnormal left ventricular relaxation (grade 1 diastolic dysfunction). - Aortic valve: Trivial regurgitation. Transthoracic echocardiography. M-mode, complete 2D, spectral Doppler, and color Doppler. Height: Height: 152.4cm. Height: 60in. Weight: Weight: 59.2kg. Weight: 130.2lb. Body mass index: BMI: 25.5kg/m^2. Body surface area: BSA: 1.31m^2. Blood pressure: 117/75. Patient status: Inpatient. Location: Bedside.  ------------------------------------------------------------  ------------------------------------------------------------ Left ventricle: The cavity size was normal. Wall thickness was normal. Systolic function was  normal. The estimated ejection fraction was in the range of 55% to 60%. Regional wall motion abnormalities cannot be excluded. Doppler parameters are consistent with abnormal left ventricular relaxation (grade 1 diastolic dysfunction).  ------------------------------------------------------------ Aortic valve: Trileaflet; normal thickness leaflets. Mobility was not restricted. Doppler: Transvalvular velocity was within the normal range. There was no stenosis. Trivial regurgitation.  ------------------------------------------------------------ Aorta: Aortic root: The aortic root was normal in size.  ------------------------------------------------------------ Mitral valve: Structurally normal valve. Mobility was not restricted. Doppler: Transvalvular velocity was within the normal range. There was no evidence for stenosis. Trivial regurgitation. Peak gradient: 2mm Hg  (D).  ------------------------------------------------------------ Left atrium: The atrium was normal in size.  ------------------------------------------------------------ Right ventricle: The cavity size was normal. Systolic function was normal.  ------------------------------------------------------------ Pulmonic valve: Doppler: Transvalvular velocity was within the normal range. There was no evidence for stenosis. Trivial regurgitation.  ------------------------------------------------------------ Tricuspid valve: Poorly visualized. Doppler: Transvalvular velocity was within the normal range. Mild regurgitation.  ------------------------------------------------------------ Pulmonary artery: Systolic pressure was within the normal range.  ------------------------------------------------------------ Right atrium: The atrium was normal in size.  ------------------------------------------------------------ Pericardium: There was no pericardial effusion.  ------------------------------------------------------------  2D measurements Normal Doppler measurements Normal Left ventricle Left ventricle LVID ED, 37.2 mm 43-52 Ea, lat ann, 9.7 cm/s ------ chord, tiss DP 5 PLAX E/Ea, lat 7.7 ------ LVID ES, 24.7 mm 23-38 ann, tiss DP 4 chord, Ea, med ann, 9.5 cm/s ------ PLAX tiss DP 5 FS, chord, 34 % >29 E/Ea, med 7.9 ------ PLAX ann, tiss DP 1 LVPW, ED 9.76 mm ------ Mitral valve IVS/LVPW 1.17 <1.3 Peak E vel 75. cm/s ------ ratio, ED 5 Ventricular septum Peak A vel 57. cm/s ------ IVS, ED 11.4 mm ------ 8 Aorta Deceleration 320 ms 150-23 Root diam, 21 mm ------ time 0 ED Peak 2 mm ------ Left atrium gradient, D Hg AP dim 30 mm ------ Peak E/A 1.3 ------ AP dim 1.88 cm/m^2 <2.2 ratio index Tricuspid valve Regurg peak 209 cm/s ------ vel Peak RV-RA 17 mm ------ gradient, S Hg  ------------------------------------------------------------ Prepared and Electronically  Authenticated by  Olga Millers 2014-01-09T16:29:[id number]  Cardiac Cath: Cardiac Catheterization Procedure Note  Name: Abigail Solis  MRN: [id number]  DOB: 28-Dec-1940  Procedure: Left Heart Cath, Selective Coronary Angiography, LV angiography  Indication: 72 year old white female who presents with a non-ST elevation myocardial infarction. ECG shows diffuse T-wave inversion and troponins are abnormal.  Procedural Details: The right wrist was prepped, draped, and anesthetized with 1% lidocaine. Using the modified Seldinger technique, a 5 French sheath was introduced into the right radial artery. 3 mg of verapamil was administered through the sheath, weight-based unfractionated heparin was administered intravenously. Standard Judkins catheters were used for selective coronary angiography and left ventriculography. Catheter exchanges were performed over an exchange length guidewire. There were no immediate procedural complications. A TR band was used for radial hemostasis at the completion of the procedure. The patient was transferred to the post catheterization recovery area for further monitoring.  Procedural Findings:  Hemodynamics:  AO 116/59 with a mean of 83 mmHg  LV 126/11 mmHg  Coronary angiography:  Coronary dominance: right  Left mainstem: There is mild 20% narrowing at the ostium.  Left anterior descending (LAD): The LAD has an 80% stenosis immediately after the takeoff of the first diagonal followed by 70% stenosis in the mid vessel. The first diagonal is a large branch which has a 90% stenosis proximally. The segment of disease does extend to the ostium.  Left circumflex (LCx): The left circumflex is  a large vessel. It gives rise to 3 marginal branches before terminating on the posterior lateral wall. There is 50% stenosis in the mid circumflex between the first and second marginal branches. There is 30% disease distal to the second OM.  Right coronary artery (RCA): The right coronary  is a codominant vessel. It has a 90-95% stenosis in the proximal to mid vessel. This is a segmental stenosis.  Left ventriculography: Left ventricular systolic function is normal, LVEF is estimated at 6065%, there is a small focal area of inferior apical hypokinesis,there is no significant mitral regurgitation  Final Conclusions:  1. Severe two-vessel obstructive coronary disease.  2. Well-preserved left ventricular function.  Recommendations: The right coronary would be suitable for percutaneous intervention. The difficulty is the bifurcation disease in the LAD and large diagonal branch. Given this the patient may be best served with revascularization with coronary bypass surgery. We will discuss these options with the patient and her family.  Theron Arista [facility]  10/04/2012, 12:23 PM   Assessment / Plan:     New onset of angina, with LAD dig and RCA coronary artery disease, agree CABG offers best and safest treatment for CAD. The risks and options are reviewed with patient and family. Unless emergency schedule is full until Tuesday am. With her presentation keep in hospital until surgery. She has had questions answered and is will to proceed.    Delight Ovens MD  Beeper [phone] Office [phone] 10/05/2012 4:26 PM

## 2012-10-05 NOTE — Progress Notes (Signed)
ANTICOAGULATION CONSULT NOTE - Follow Up Consult  Pharmacy Consult for Heparin Indication: CAD awaiting CABG  Allergies  Allergen Reactions  . Nsaids Hives and Itching    blisters    Patient Measurements: Height: 5' (152.4 cm) Weight: 130 lb 1.1 oz (59 kg) IBW/kg (Calculated) : 45.5   Vital Signs: Temp: 98.1 F (36.7 C) (01/10 2100) Temp src: Oral (01/10 1335) BP: 126/72 mmHg (01/10 2100) Pulse Rate: 58  (01/10 2100)  Labs:  Basename 10/05/12 2233 10/05/12 1310 10/05/12 0500 10/04/12 0759 10/04/12 0244  HGB -- -- 11.7* -- 11.7*  HCT -- -- 35.8* -- 36.7  PLT -- -- 262 -- 275  APTT -- -- -- -- --  LABPROT -- -- -- -- 13.0  INR -- -- -- -- 0.99  HEPARINUNFRC 0.95* 0.65 0.15* -- --  CREATININE -- -- -- -- 0.56  CKTOTAL -- -- -- -- --  CKMB -- -- -- -- --  TROPONINI -- -- -- 0.85* 0.96*    Estimated Creatinine Clearance: 51.8 ml/min (by C-G formula based on Cr of 0.56).   Medications:  Infusions:     . heparin 900 Units/hr (10/05/12 1610)    Assessment: 72 year old female who is on heparin for CAD pending CABG on 1/14.    Heparin level (0.95) is above-goal on 900 units/hr. Per RN no bleeding.   Goal of Therapy:  Heparin level 0.3-0.7 units/ml Monitor platelets by anticoagulation protocol: Yes   Plan:  1. Decrease IV heparin to 800 units/hr.  2. Heparin level in 8 hours.   Lorre Munroe, PharmD, BCPS 10/05/2012, 11:42 PM

## 2012-10-05 NOTE — Progress Notes (Signed)
ANTICOAGULATION CONSULT NOTE - Follow Up Consult  Pharmacy Consult for Heparin Indication: CAD awaiting CABG  Allergies  Allergen Reactions  . Nsaids Hives and Itching    blisters    Patient Measurements: Height: 5' (152.4 cm) Weight: 130 lb 1.1 oz (59 kg) IBW/kg (Calculated) : 45.5   Vital Signs: Temp: 98.8 F (37.1 C) (01/10 0622) Temp src: Oral (01/10 0622) BP: 137/81 mmHg (01/10 0622) Pulse Rate: 65  (01/10 0622)  Labs:  Basename 10/05/12 1310 10/05/12 0500 10/04/12 0759 10/04/12 0244  HGB -- 11.7* -- 11.7*  HCT -- 35.8* -- 36.7  PLT -- 262 -- 275  APTT -- -- -- --  LABPROT -- -- -- 13.0  INR -- -- -- 0.99  HEPARINUNFRC 0.65 0.15* -- --  CREATININE -- -- -- 0.56  CKTOTAL -- -- -- --  CKMB -- -- -- --  TROPONINI -- -- 0.85* 0.96*    Estimated Creatinine Clearance: 51.8 ml/min (by C-G formula based on Cr of 0.56).   Medications:  Infusions:    . [EXPIRED] sodium chloride Stopped (10/04/12 2235)  . heparin 900 Units/hr (10/05/12 1610)  . [DISCONTINUED] sodium chloride    . [DISCONTINUED] heparin 750 Units/hr (10/04/12 9604)    Assessment: 72 year old female who is now therapeutic on Heparin for CAD pending CABG on 1/14.  No bleeding noted.  Goal of Therapy:  Heparin level 0.3-0.7 units/ml Monitor platelets by anticoagulation protocol: Yes   Plan:  Continue Heparin at 900 units/hr Recheck heparin level in 6 hours to confirm  Estella Husk, Pharm.D., BCPS Clinical Pharmacist  Phone [phone] Pager [phone] 10/05/2012, 1:51 PM

## 2012-10-05 NOTE — Consult Note (Signed)
Subjective:   Abigail Solis is a 72 yo with no known history of Coronary Artery Disease.  The patient developed a headache Tuesday evening that transitioned into some jaw and mild neck discomfort.  The patient also noted some chest discomfort as well.  She stated this started around 5 pm and last around 5 hours.  Her husband took her her blood pressure which was elevated.  The patient did not sleep much that night.  The patients chest pain did not return.  She was evaluated by Dr. Margo Aye at the office on Wednesday.  He obtained cardiac enzymes which were slightly elevated.  Therefore she was referred to the Emergency Department for further workup.  Upon arrival EKG was obtained and showed diffuse T wave inversions.  The patient denied chest pain at that time.  Due to the elevated Cardiac enzymes and EKG changes it was felt the patient should be admitted for evaluation for possible CAD.  She was placed on Heparin and NTG.  Her cardiac enzymes continued to rise.  She was taken for cardiac catheterization on 10/04/2012 and found to have a preserved EF of 60-65% and severe 2 vessel obstructive coronary disease.  It was felt the patient would benefit from coronary re-vascularization by coronary bypass surgery and TCTS was consulted.  The patient is currently chest pain free and denies shortness of breath.           Patient Active Problem List   Diagnosis Date Noted  . NSTEMI (non-ST elevated myocardial infarction) 10/04/2012  . HTN (hypertension) 10/04/2012  . CHF (congestive heart failure) 10/04/2012  . Mixed hyperlipidemia 10/04/2012   Past Medical History  Diagnosis Date  . Reflux   . Chest pain 10/03/2012  . GERD (gastroesophageal reflux disease)     Past Surgical History  Procedure Date  . None   . No past surgeries     Prescriptions prior to admission  Medication Sig Dispense Refill  . omeprazole (PRILOSEC) 20 MG capsule Take 20 mg by mouth daily.       Allergies  Allergen Reactions  . Nsaids  Hives and Itching    blisters    History  Substance Use Topics  . Smoking status: Never Smoker   . Smokeless tobacco: Never Used  . Alcohol Use: No    Family History  Problem Relation Age of Onset  . Sudden death Father 76    Review of Systems Pertinent items are noted in HPI.  Objective:   Patient Vitals for the past 8 hrs:  BP Temp Temp src Pulse Resp SpO2 Weight  10/05/12 0622 137/81 mmHg 98.8 F (37.1 C) Oral 65  17  95 % 130 lb 1.1 oz (59 kg)          BP 137/81  Pulse 65  Temp 98.8 F (37.1 C) (Oral)  Resp 17  Ht 5' (1.524 m)  Wt 130 lb 1.1 oz (59 kg)  BMI 25.40 kg/m2  SpO2 95% General appearance: alert, cooperative and no distress Head: Normocephalic, without obvious abnormality, atraumatic Eyes: conjunctivae/corneas clear. PERRL, EOM's intact. Fundi benign. Lungs: clear to auscultation bilaterally Heart: regular rate and rhythm, S1, S2 normal, no murmur, click, rub or gallop Abdomen: soft, non-tender; bowel sounds normal; no masses,  no organomegaly Extremities: extremities normal, atraumatic, no cyanosis or edema Skin: Skin color, texture, turgor normal. No rashes or lesions Neurologic: Grossly normal  ECG: NSR, no ST elevations,   A/P:  1. NSTEMI 2. Coronary Artery Disease- Dr. Tyrone Sage has evaluated  patient, risks and benefits have been explained and patient is agreeable to proceed with surgery.  Plan for surgery on Tuesday

## 2012-10-06 DIAGNOSIS — E876 Hypokalemia: Secondary | ICD-10-CM

## 2012-10-06 LAB — BASIC METABOLIC PANEL
BUN: 19 mg/dL (ref 6–23)
Chloride: 106 mEq/L (ref 96–112)
Glucose, Bld: 102 mg/dL — ABNORMAL HIGH (ref 70–99)
Potassium: 3 mEq/L — ABNORMAL LOW (ref 3.5–5.1)

## 2012-10-06 LAB — HEPARIN LEVEL (UNFRACTIONATED)
Heparin Unfractionated: 0.76 IU/mL — ABNORMAL HIGH (ref 0.30–0.70)
Heparin Unfractionated: 0.57 IU/mL (ref 0.30–0.70)

## 2012-10-06 LAB — CBC
HCT: 34.6 % — ABNORMAL LOW (ref 36.0–46.0)
Hemoglobin: 11.2 g/dL — ABNORMAL LOW (ref 12.0–15.0)
MCV: 88.7 fL (ref 78.0–100.0)
WBC: 8.4 10*3/uL (ref 4.0–10.5)

## 2012-10-06 MED ORDER — POTASSIUM CHLORIDE CRYS ER 20 MEQ PO TBCR
40.0000 meq | EXTENDED_RELEASE_TABLET | ORAL | Status: AC
  Administered 2012-10-06 (×2): 40 meq via ORAL
  Filled 2012-10-06 (×2): qty 2

## 2012-10-06 MED ORDER — SODIUM CHLORIDE 0.9 % IV SOLN
INTRAVENOUS | Status: DC
  Administered 2012-10-07: via INTRAVENOUS

## 2012-10-06 NOTE — Progress Notes (Signed)
  Patient Name: Abigail Solis      SUBJECTIVE: admitted with CP + Tn  And at cath found to have severe 2V CAD and TCTS consulted and CAB recommmended  Recurrent discomfort last pm qualitatively similar to presenting symptoms although quantitatively less; duration 30 min  Past Medical History  Diagnosis Date  . Reflux   . Chest pain 10/03/2012  . GERD (gastroesophageal reflux disease)     PHYSICAL EXAM Filed Vitals:   10/05/12 0622 10/05/12 1335 10/05/12 2100 10/06/12 0500  BP: 137/81 107/70 126/72 127/80  Pulse: 65 60 58 57  Temp: 98.8 F (37.1 C) 97.5 F (36.4 C) 98.1 F (36.7 C) 98.8 F (37.1 C)  TempSrc: Oral Oral    Resp: 17 20 18 18   Height:      Weight: 130 lb 1.1 oz (59 kg)     SpO2: 95% 95% 94% 95%    Well developed and nourished in no acute distress HENT normal Neck supple with JVP-flat Clear Regular rate and rhythm, no murmurs or gallops Abd-soft with active BS No Clubbing cyanosis edema Skin-warm and dry A & Oriented  Grossly normal sensory and motor function  TELEMETRY: Reviewed telemetry pt in nsr :    Intake/Output Summary (Last 24 hours) at 10/06/12 0852 Last data filed at 10/05/12 1300  Gross per 24 hour  Intake    240 ml  Output      0 ml  Net    240 ml    LABS: Basic Metabolic Panel:  Lab 10/04/12 8469  NA 139  K 3.4*  CL 104  CO2 24  GLUCOSE 98  BUN 14  CREATININE 0.56  CALCIUM 9.3  MG 2.1  PHOS --   Cardiac Enzymes:  Basename 10/04/12 0759 10/04/12 0244  CKTOTAL -- --  CKMB -- --  CKMBINDEX -- --  TROPONINI 0.85* 0.96*   CBC:  Lab 10/05/12 0500 10/04/12 0244  WBC 6.0 4.9  NEUTROABS -- 2.7  HGB 11.7* 11.7*  HCT 35.8* 36.7  MCV 89.5 87.8  PLT 262 275   PROTIME:  Basename 10/04/12 0244  LABPROT 13.0  INR 0.99   Liver Function Tests:  Basename 10/04/12 0244  AST 23  ALT 14  ALKPHOS 56  BILITOT 0.3  PROT 9.3*  ALBUMIN 3.4*   No results found for this basename: LIPASE:2,AMYLASE:2 in the last 72  hours BNP: BNP (last 3 results)  Basename 10/04/12 0244  PROBNP 898.6*   D-Dimer: No results found for this basename: DDIMER:2 in the last 72 hours Hemoglobin A1C:  Basename 10/04/12 0244  HGBA1C 6.0*   Fasting Lipid Panel:  Basename 10/04/12 0300  CHOL 195  HDL 56  LDLCALC 126*  TRIG 65  CHOLHDL 3.5  LDLDIRECT --   Thyroid Function Tests:  Basename 10/04/12 0244  TSH 2.214  T4TOTAL --  T3FREE --  THYROIDAB --  Anemia Panel:   ASSESSMENT AND PLAN:  Patient Active Hospital Problem List: NSTEMI (non-ST elevated myocardial infarction) (10/04/2012)   HTN (hypertension) (10/04/2012)   Hypokalemia   Mixed hyperlipidemia (10/04/2012)   REplete K Continue bb and hep and asa Recheck Troponin Begin NTG  Will let TCTS know and use 2B3A if + Ez  Signed, Sherryl Manges MD  10/06/2012

## 2012-10-06 NOTE — Progress Notes (Signed)
ANTICOAGULATION CONSULT NOTE - Follow Up Consult  Pharmacy Consult for Heparin Indication: CAD awaiting CABG  Allergies  Allergen Reactions  . Nsaids Hives and Itching    blisters    Patient Measurements: Height: 5' (152.4 cm) Weight: 130 lb 1.1 oz (59 kg) IBW/kg (Calculated) : 45.5   Vital Signs: Temp: 98.8 F (37.1 C) (01/11 0500) BP: 151/83 mmHg (01/11 1000) Pulse Rate: 59  (01/11 1000)  Labs:  Basename 10/06/12 0915 10/06/12 0900 10/05/12 2233 10/05/12 1310 10/05/12 0500 10/04/12 0759 10/04/12 0244  HGB -- 11.2* -- -- 11.7* -- --  HCT -- 34.6* -- -- 35.8* -- 36.7  PLT -- 246 -- -- 262 -- 275  APTT -- -- -- -- -- -- --  LABPROT -- -- -- -- -- -- 13.0  INR -- -- -- -- -- -- 0.99  HEPARINUNFRC -- 0.76* 0.95* 0.65 -- -- --  CREATININE -- 0.58 -- -- -- -- 0.56  CKTOTAL -- -- -- -- -- -- --  CKMB -- -- -- -- -- -- --  TROPONINI <0.30 -- -- -- -- 0.85* 0.96*    Estimated Creatinine Clearance: 51.8 ml/min (by C-G formula based on Cr of 0.58).   Medications:  Infusions:     . heparin 800 Units/hr (10/05/12 2359)    Assessment: 72 year old female who is on heparin for CAD pending CABG on 1/14.    Heparin level (0.76) is above-goal on 800 units/hr. Per RN no bleeding.   Goal of Therapy:  Heparin level 0.3-0.7 units/ml Monitor platelets by anticoagulation protocol: Yes   Plan:  1. Decrease IV heparin to 700 units/hr.  2. Heparin level in 8 hours.   Estella Husk, Pharm.D., BCPS Clinical Pharmacist  Phone [phone] Pager [phone] 10/06/2012, 10:48 AM

## 2012-10-06 NOTE — Progress Notes (Signed)
Pt arrives from floor 3000 via bed.  Denies pain at this time.  No s/s of any acute distress.  Oriented pt to room and unit.  Call bell in reach.

## 2012-10-06 NOTE — Progress Notes (Signed)
ANTICOAGULATION CONSULT NOTE - Follow Up Consult  Pharmacy Consult for heparin Indication: CAD awaiting CABG  Allergies  Allergen Reactions  . Nsaids Hives and Itching    blisters    Patient Measurements: Height: 5' (152.4 cm) Weight: 130 lb 1.1 oz (59 kg) IBW/kg (Calculated) : 45.5  Heparin Dosing Weight:   Vital Signs: Temp: 98.7 F (37.1 C) (01/11 1951) Temp src: Oral (01/11 1951) BP: 148/73 mmHg (01/11 1938) Pulse Rate: 59  (01/11 1000)  Labs:  Basename 10/06/12 2037 10/06/12 1642 10/06/12 0915 10/06/12 0900 10/05/12 2233 10/05/12 0500 10/04/12 0759 10/04/12 0244  HGB -- -- -- 11.2* -- 11.7* -- --  HCT -- -- -- 34.6* -- 35.8* -- 36.7  PLT -- -- -- 246 -- 262 -- 275  APTT -- -- -- -- -- -- -- --  LABPROT -- -- -- -- -- -- -- 13.0  INR -- -- -- -- -- -- -- 0.99  HEPARINUNFRC 0.57 -- -- 0.76* 0.95* -- -- --  CREATININE -- -- -- 0.58 -- -- -- 0.56  CKTOTAL -- -- -- -- -- -- -- --  CKMB -- -- -- -- -- -- -- --  TROPONINI -- <0.30 <0.30 -- -- -- 0.85* --    Estimated Creatinine Clearance: 51.8 ml/min (by C-G formula based on Cr of 0.58).   Medications:  Scheduled:    . aspirin EC  81 mg Oral Daily  . atorvastatin  80 mg Oral q1800  . metoprolol tartrate  25 mg Oral BID  . pantoprazole  40 mg Oral Daily  . [COMPLETED] potassium chloride  40 mEq Oral Q4H   Infusions:    . sodium chloride 10 mL/hr at 10/06/12 2000  . heparin 700 Units/hr (10/06/12 2000)    Assessment: 72 yr female with CAD awaiting CABG is currently on therapeutic heparin.  Heparin level was 0.57.   Goal of Therapy:  Heparin level 0.3-0.7 units/ml Monitor platelets by anticoagulation protocol: Yes   Plan:  1) Continue heparin at 700 units/hr 2) Heparin level and CBC in am  Kelven Flater, Tsz-Yin 10/06/2012,9:19 PM

## 2012-10-06 NOTE — Progress Notes (Signed)
   CARDIOTHORACIC SURGERY PROGRESS NOTE  2 Days Post-Op  S/P Procedure(s) (LRB): LEFT HEART CATHETERIZATION WITH CORONARY ANGIOGRAM (N/A)  Subjective: Brief episode chest pain this morning, none since.  Feels well.  Objective: Vital signs in last 24 hours: Temp:  [97.5 F (36.4 C)-98.8 F (37.1 C)] 98.8 F (37.1 C) (01/11 0500) Pulse Rate:  [57-60] 59  (01/11 1000) Cardiac Rhythm:  [-] Normal sinus rhythm;Sinus bradycardia (01/11 0747) Resp:  [18-20] 18  (01/11 0500) BP: (107-151)/(70-83) 151/83 mmHg (01/11 1000) SpO2:  [94 %-95 %] 95 % (01/11 0500)  Physical Exam:  Rhythm:   sinus  Breath sounds: clear  Heart sounds:  RRR  Incisions:  n/a  Abdomen:  soft  Extremities:  warm   Intake/Output from previous day: 01/10 0701 - 01/11 0700 In: 600 [P.O.:600] Out: -  Intake/Output this shift: Total I/O In: 360 [P.O.:360] Out: -   Lab Results:  Basename 10/06/12 0900 10/05/12 0500  WBC 8.4 6.0  HGB 11.2* 11.7*  HCT 34.6* 35.8*  PLT 246 262   BMET:  Basename 10/06/12 0900 10/04/12 0244  NA 138 139  K 3.0* 3.4*  CL 106 104  CO2 22 24  GLUCOSE 102* 98  BUN 19 14  CREATININE 0.58 0.56  CALCIUM 8.4 9.3    CBG (last 3)  No results found for this basename: GLUCAP:3 in the last 72 hours PT/INR:   Basename 10/04/12 0244  LABPROT 13.0  INR 0.99   Results for AUDRIANA, ALDAMA (MRN [id number]) as of 10/06/2012 12:34  Ref. Range 10/06/2012 09:15  Troponin I Latest Range: <0.30 ng/mL <0.30   EKG:   NSR w/out acute changes  CXR:  N/A  Assessment/Plan: S/P Procedure(s) (LRB): LEFT HEART CATHETERIZATION WITH CORONARY ANGIOGRAM (N/A)  Brief episode chest pain this morning c/w unstable angina.  Wasn't given NTG.  EKG unremarkable and troponin negative.    I agree w/ plans to transfer to CCU.  Would consider NTG.    Will follow.  Tentatively for OR Tuesday  OWEN,CLARENCE H 10/06/2012 12:33 PM

## 2012-10-07 DIAGNOSIS — I2510 Atherosclerotic heart disease of native coronary artery without angina pectoris: Secondary | ICD-10-CM

## 2012-10-07 LAB — CBC
HCT: 34.8 % — ABNORMAL LOW (ref 36.0–46.0)
MCV: 89.5 fL (ref 78.0–100.0)
RDW: 13.5 % (ref 11.5–15.5)
WBC: 4.2 10*3/uL (ref 4.0–10.5)

## 2012-10-07 LAB — BASIC METABOLIC PANEL
BUN: 15 mg/dL (ref 6–23)
CO2: 22 mEq/L (ref 19–32)
Chloride: 106 mEq/L (ref 96–112)
Creatinine, Ser: 0.65 mg/dL (ref 0.50–1.10)
GFR calc Af Amer: >90 mL/min (ref >90)
Glucose, Bld: 95 mg/dL (ref 70–99)

## 2012-10-07 MED ORDER — NITROGLYCERIN IN D5W 200-5 MCG/ML-% IV SOLN
2.0000 ug/min | INTRAVENOUS | Status: DC
  Administered 2012-10-07: 5 ug/min via INTRAVENOUS
  Filled 2012-10-07: qty 250

## 2012-10-07 NOTE — Progress Notes (Addendum)
Patient ID: Abigail Solis, female   DOB: 01-20-1941, 72 y.o.   MRN: [id number]    SUBJECTIVE: Episode of mild chest pain this morning, now resolved.  Same thing happened yesterday, troponin remains negative.      Marland Kitchen aspirin EC  81 mg Oral Daily  . atorvastatin  80 mg Oral q1800  . metoprolol tartrate  25 mg Oral BID  . pantoprazole  40 mg Oral Daily      Filed Vitals:   10/06/12 1951 10/07/12 0019 10/07/12 0318 10/07/12 0850  BP:  105/50 95/48   Pulse:   54   Temp: 98.7 F (37.1 C) 98.6 F (37 C) 98.1 F (36.7 C) 98.4 F (36.9 C)  TempSrc: Oral Oral Oral   Resp:  18 18 18   Height:      Weight:      SpO2: 96% 96% 98% 97%    Intake/Output Summary (Last 24 hours) at 10/07/12 0851 Last data filed at 10/07/12 0600  Gross per 24 hour  Intake 760.03 ml  Output      0 ml  Net 760.03 ml    LABS: Basic Metabolic Panel:  Basename 10/07/12 0510 10/06/12 0900  NA 138 138  K 3.7 3.0*  CL 106 106  CO2 22 22  GLUCOSE 95 102*  BUN 15 19  CREATININE 0.65 0.58  CALCIUM 8.7 8.4  MG -- --  PHOS -- --   Liver Function Tests: No results found for this basename: AST:2,ALT:2,ALKPHOS:2,BILITOT:2,PROT:2,ALBUMIN:2 in the last 72 hours No results found for this basename: LIPASE:2,AMYLASE:2 in the last 72 hours CBC:  Basename 10/07/12 0510 10/06/12 0900  WBC 4.2 8.4  NEUTROABS -- --  HGB 11.1* 11.2*  HCT 34.8* 34.6*  MCV 89.5 88.7  PLT 235 246   Cardiac Enzymes:  Basename 10/06/12 1642 10/06/12 0915  CKTOTAL -- --  CKMB -- --  CKMBINDEX -- --  TROPONINI <0.30 <0.30   BNP: No components found with this basename: POCBNP:3 D-Dimer: No results found for this basename: DDIMER:2 in the last 72 hours Hemoglobin A1C: No results found for this basename: HGBA1C in the last 72 hours Fasting Lipid Panel: No results found for this basename: CHOL,HDL,LDLCALC,TRIG,CHOLHDL,LDLDIRECT in the last 72 hours Thyroid Function Tests: No results found for this basename:  TSH,T4TOTAL,FREET3,T3FREE,THYROIDAB in the last 72 hours Anemia Panel: No results found for this basename: VITAMINB12,FOLATE,FERRITIN,TIBC,IRON,RETICCTPCT in the last 72 hours  RADIOLOGY: Dg Chest 2 View  10/03/2012  *RADIOLOGY REPORT*  Clinical Data: Hypertension and tachycardia.  CHEST - 2 VIEW  Comparison: None.  Findings: Lungs are clear.  Heart size is normal.  No pneumothorax or pleural fluid.  IMPRESSION: No acute disease.   Original Report Authenticated By: Holley Dexter, M.D.     PHYSICAL EXAM General: NAD Neck: No JVD, no thyromegaly or thyroid nodule.  Lungs: Clear to auscultation bilaterally with normal respiratory effort. CV: Nondisplaced PMI.  Heart regular S1/S2, no S3/S4, no murmur.  No peripheral edema.  No carotid bruit.  Normal pedal pulses.  Abdomen: Soft, nontender, no hepatosplenomegaly, no distention.  Neurologic: Alert and oriented x 3.  Psych: Normal affect. Extremities: No clubbing or cyanosis.   TELEMETRY: Reviewed telemetry pt in NSR  ASSESSMENT AND PLAN:  72 yo presented with NSTEMI, had left heart cath showing severe LAD and RCA disease.  Plan for CABG on Tuesday.  She had another episode of mild CP this morning.  Cardiac enzymes were negative yesterday.  - Continue ASA, heparin gtt, statin, metoprolol - Add  NTG gtt to try to keep her CP-free.  - Can repeat troponin this morning => if it rises again, would add Integrilin.   Abigail Solis 10/07/2012 8:53 AM

## 2012-10-07 NOTE — Plan of Care (Signed)
Problem: Phase II Progression Outcomes Goal: Anginal pain absent Outcome: Progressing Intermittent anginal pain persists.  Pain free at present; NTG gtt ordered to titrate for pain.

## 2012-10-07 NOTE — Progress Notes (Signed)
Patients heart rate and blood pressure low. HR 56 and BP 103/48 nitro turned off. Pt has had no c/o chest or jaw pain all night.                       Carollynn Pennywell stophelRN

## 2012-10-07 NOTE — Progress Notes (Signed)
   CARDIOTHORACIC SURGERY PROGRESS NOTE  3 Days Post-Op  S/P Procedure(s) (LRB): LEFT HEART CATHETERIZATION WITH CORONARY ANGIOGRAM (N/A)  Subjective: Very transient episode of CP this morning when she awoke, none since.  ECG and Troponins remain negative  Objective: Vital signs in last 24 hours: Temp:  [98.1 F (36.7 C)-98.9 F (37.2 C)] 98.9 F (37.2 C) (01/12 1208) Pulse Rate:  [54] 54  (01/12 0318) Cardiac Rhythm:  [-] Normal sinus rhythm (01/12 0800) Resp:  [18] 18  (01/12 1208) BP: (93-148)/(48-73) 93/48 mmHg (01/12 1100) SpO2:  [96 %-98 %] 96 % (01/12 1208)  Physical Exam:  Rhythm:   sinus  Breath sounds: clear  Heart sounds:  RRR  Incisions:  n/a  Abdomen:  soft  Extremities:  warm   Intake/Output from previous day: 01/11 0701 - 01/12 0700 In: 1120 [P.O.:360; I.V.:760] Out: -  Intake/Output this shift: Total I/O In: 86.8 [I.V.:86.8] Out: -   Lab Results:  Basename 10/07/12 0510 10/06/12 0900  WBC 4.2 8.4  HGB 11.1* 11.2*  HCT 34.8* 34.6*  PLT 235 246   BMET:  Basename 10/07/12 0510 10/06/12 0900  NA 138 138  K 3.7 3.0*  CL 106 106  CO2 22 22  GLUCOSE 95 102*  BUN 15 19  CREATININE 0.65 0.58  CALCIUM 8.7 8.4    CBG (last 3)  No results found for this basename: GLUCAP:3 in the last 72 hours PT/INR:  No results found for this basename: LABPROT,INR in the last 72 hours  CXR:  N/A  Assessment/Plan: S/P Procedure(s) (LRB): LEFT HEART CATHETERIZATION WITH CORONARY ANGIOGRAM (N/A)  Tentatively for CABG Tuesday  Saumya Hukill H 10/07/2012 1:58 PM

## 2012-10-07 NOTE — Progress Notes (Signed)
ANTICOAGULATION CONSULT NOTE - Follow Up Consult  Pharmacy Consult for heparin Indication: CAD awaiting CABG  Allergies  Allergen Reactions  . Nsaids Hives and Itching    blisters    Patient Measurements: Height: 5' (152.4 cm) Weight: 130 lb 1.1 oz (59 kg) IBW/kg (Calculated) : 45.5  Heparin Dosing Weight:   Vital Signs: Temp: 98.4 F (36.9 C) (01/12 0850) Temp src: Oral (01/12 0318) BP: 127/60 mmHg (01/12 0800) Pulse Rate: 54  (01/12 0318)  Labs:  Basename 10/07/12 0910 10/07/12 0510 10/06/12 2037 10/06/12 1642 10/06/12 0915 10/06/12 0900 10/05/12 0500  HGB -- 11.1* -- -- -- 11.2* --  HCT -- 34.8* -- -- -- 34.6* 35.8*  PLT -- 235 -- -- -- 246 262  APTT -- -- -- -- -- -- --  LABPROT -- -- -- -- -- -- --  INR -- -- -- -- -- -- --  HEPARINUNFRC -- 0.52 0.57 -- -- 0.76* --  CREATININE -- 0.65 -- -- -- 0.58 --  CKTOTAL -- -- -- -- -- -- --  CKMB -- -- -- -- -- -- --  TROPONINI <0.30 -- -- <0.30 <0.30 -- --    Estimated Creatinine Clearance: 51.8 ml/min (by C-G formula based on Cr of 0.65).   Medications:  Scheduled:     . aspirin EC  81 mg Oral Daily  . atorvastatin  80 mg Oral q1800  . metoprolol tartrate  25 mg Oral BID  . pantoprazole  40 mg Oral Daily  . [COMPLETED] potassium chloride  40 mEq Oral Q4H   Infusions:     . sodium chloride 10 mL/hr at 10/07/12 0600  . heparin 700 Units/hr (10/07/12 0600)  . nitroGLYCERIN 5 mcg/min (10/07/12 0950)    Assessment: 72 yr female with CAD awaiting CABG is currently on therapeutic heparin.  Heparin level  0.52.  H/H and plts stable  Goal of Therapy:  Heparin level 0.3-0.7 units/ml Monitor platelets by anticoagulation protocol: Yes   Plan:  1) Continue heparin at 700 units/hr 2) Daily heparin level, CBC   Rhayne Chatwin 10/07/2012,10:09 AM

## 2012-10-08 LAB — CBC
HCT: 33.7 % — ABNORMAL LOW (ref 36.0–46.0)
Hemoglobin: 11.1 g/dL — ABNORMAL LOW (ref 12.0–15.0)
MCH: 29.2 pg (ref 26.0–34.0)
MCHC: 32.9 g/dL (ref 30.0–36.0)
MCV: 88.7 fL (ref 78.0–100.0)
Platelets: 225 10*3/uL (ref 150–400)
RBC: 3.8 MIL/uL — ABNORMAL LOW (ref 3.87–5.11)
RDW: 13.4 % (ref 11.5–15.5)
WBC: 4.3 10*3/uL (ref 4.0–10.5)

## 2012-10-08 LAB — BASIC METABOLIC PANEL
BUN: 16 mg/dL (ref 6–23)
CO2: 23 mEq/L (ref 19–32)
Calcium: 8.6 mg/dL (ref 8.4–10.5)
Creatinine, Ser: 0.66 mg/dL (ref 0.50–1.10)
Glucose, Bld: 93 mg/dL (ref 70–99)

## 2012-10-08 MED ORDER — CHLORHEXIDINE GLUCONATE 4 % EX LIQD
60.0000 mL | Freq: Once | CUTANEOUS | Status: AC
  Administered 2012-10-09: 4 via TOPICAL
  Filled 2012-10-08: qty 15

## 2012-10-08 MED ORDER — SODIUM CHLORIDE 0.9 % IV SOLN
INTRAVENOUS | Status: AC
  Administered 2012-10-09: 1.4 [IU]/h via INTRAVENOUS
  Filled 2012-10-08: qty 1

## 2012-10-08 MED ORDER — NITROGLYCERIN IN D5W 200-5 MCG/ML-% IV SOLN
2.0000 ug/min | INTRAVENOUS | Status: DC
  Filled 2012-10-08: qty 250

## 2012-10-08 MED ORDER — PHENYLEPHRINE HCL 10 MG/ML IJ SOLN
30.0000 ug/min | INTRAVENOUS | Status: DC
  Filled 2012-10-08: qty 2

## 2012-10-08 MED ORDER — TEMAZEPAM 15 MG PO CAPS
15.0000 mg | ORAL_CAPSULE | Freq: Once | ORAL | Status: AC | PRN
  Administered 2012-10-08: 15 mg via ORAL
  Filled 2012-10-08: qty 1

## 2012-10-08 MED ORDER — VERAPAMIL HCL 2.5 MG/ML IV SOLN
INTRAVENOUS | Status: AC
  Administered 2012-10-09: 09:00:00
  Filled 2012-10-08: qty 2.5

## 2012-10-08 MED ORDER — DEXTROSE 5 % IV SOLN
1.5000 g | INTRAVENOUS | Status: AC
  Administered 2012-10-09: 1.5 g via INTRAVENOUS
  Administered 2012-10-09: .75 g via INTRAVENOUS
  Filled 2012-10-08: qty 1.5

## 2012-10-08 MED ORDER — SODIUM CHLORIDE 0.9 % IV SOLN
INTRAVENOUS | Status: AC
  Administered 2012-10-09: 69.8 mL/h via INTRAVENOUS
  Administered 2012-10-09: 12:00:00 via INTRAVENOUS
  Filled 2012-10-08: qty 40

## 2012-10-08 MED ORDER — EPINEPHRINE HCL 1 MG/ML IJ SOLN
0.5000 ug/min | INTRAVENOUS | Status: DC
  Filled 2012-10-08: qty 4

## 2012-10-08 MED ORDER — CEFUROXIME SODIUM 750 MG IJ SOLR
750.0000 mg | INTRAMUSCULAR | Status: DC
  Filled 2012-10-08: qty 750

## 2012-10-08 MED ORDER — MAGNESIUM SULFATE 50 % IJ SOLN
40.0000 meq | INTRAMUSCULAR | Status: DC
  Filled 2012-10-08: qty 10

## 2012-10-08 MED ORDER — DEXMEDETOMIDINE HCL IN NACL 400 MCG/100ML IV SOLN
0.1000 ug/kg/h | INTRAVENOUS | Status: AC
  Administered 2012-10-09: 0.3 ug/kg/h via INTRAVENOUS
  Filled 2012-10-08: qty 100

## 2012-10-08 MED ORDER — DOPAMINE-DEXTROSE 3.2-5 MG/ML-% IV SOLN
2.0000 ug/kg/min | INTRAVENOUS | Status: DC
  Filled 2012-10-08: qty 250

## 2012-10-08 MED ORDER — POTASSIUM CHLORIDE 2 MEQ/ML IV SOLN
80.0000 meq | INTRAVENOUS | Status: DC
  Filled 2012-10-08: qty 40

## 2012-10-08 MED ORDER — VANCOMYCIN HCL 10 G IV SOLR
1250.0000 mg | INTRAVENOUS | Status: AC
  Administered 2012-10-09: 1250 mg via INTRAVENOUS
  Filled 2012-10-08: qty 1250

## 2012-10-08 MED ORDER — METOPROLOL TARTRATE 12.5 MG HALF TABLET
12.5000 mg | ORAL_TABLET | Freq: Once | ORAL | Status: AC
  Administered 2012-10-09: 12.5 mg via ORAL
  Filled 2012-10-08: qty 1

## 2012-10-08 MED ORDER — BISACODYL 5 MG PO TBEC
5.0000 mg | DELAYED_RELEASE_TABLET | Freq: Once | ORAL | Status: AC
  Administered 2012-10-08: 5 mg via ORAL
  Filled 2012-10-08: qty 1

## 2012-10-08 NOTE — Progress Notes (Signed)
Discussed with pt and family OHS and mobility plan. Pt has already seen video and read book. Sts MD advised not to practice IS pre-surg. Pt and family have good understanding. Will await order post op. [phone] Abigail Solis CES, ACSM

## 2012-10-08 NOTE — Progress Notes (Signed)
ANTICOAGULATION CONSULT NOTE - Follow Up Consult  Pharmacy Consult for heparin Indication: CAD awaiting CABG  Allergies  Allergen Reactions  . Nsaids Hives and Itching    blisters    Patient Measurements: Height: 5' (152.4 cm) Weight: 130 lb 1.1 oz (59 kg) IBW/kg (Calculated) : 45.5   Vital Signs: Temp: 97.8 F (36.6 C) (01/13 0725) Temp src: Oral (01/13 0725) BP: 118/60 mmHg (01/13 0725) Pulse Rate: 57  (01/13 0725)  Labs:  Basename 10/08/12 0530 10/07/12 0910 10/07/12 0510 10/06/12 2037 10/06/12 1642 10/06/12 0915 10/06/12 0900  HGB 11.1* -- 11.1* -- -- -- --  HCT 33.7* -- 34.8* -- -- -- 34.6*  PLT 225 -- 235 -- -- -- 246  APTT -- -- -- -- -- -- --  LABPROT -- -- -- -- -- -- --  INR -- -- -- -- -- -- --  HEPARINUNFRC 0.43 -- 0.52 0.57 -- -- --  CREATININE 0.66 -- 0.65 -- -- -- 0.58  CKTOTAL -- -- -- -- -- -- --  CKMB -- -- -- -- -- -- --  TROPONINI -- <0.30 -- -- <0.30 <0.30 --    Estimated Creatinine Clearance: 51.8 ml/min (by C-G formula based on Cr of 0.66).   Medications:  Scheduled:     . aspirin EC  81 mg Oral Daily  . atorvastatin  80 mg Oral q1800  . metoprolol tartrate  25 mg Oral BID  . pantoprazole  40 mg Oral Daily   Infusions:     . sodium chloride 10 mL/hr at 10/07/12 0600  . heparin 700 Units/hr (10/07/12 0600)  . nitroGLYCERIN 5 mcg/min (10/07/12 0950)    Assessment: 72 yo female with CAD awaiting CABG is currently on therapeutic heparin.  Heparin level 0.43 and at goal.  H/H and plts stable  Goal of Therapy:  Heparin level 0.3-0.7 units/ml Monitor platelets by anticoagulation protocol: Yes   Plan:  1) Continue heparin at 700 units/hr 2) Daily heparin level, CBC   Harland German, Pharm D 10/08/2012 10:17 AM   e

## 2012-10-08 NOTE — Progress Notes (Signed)
Patient ID: Abigail Solis, female   DOB: March 31, 1941, 72 y.o.   MRN: [id number]                   [address]            [place] [id number]          [phone]     4 Days Post-Op Procedure(s) (LRB): LEFT HEART CATHETERIZATION WITH CORONARY ANGIOGRAM (N/A)  LOS: 5 days   Subjective: No further chest pain, for cabg in am  Objective: Vital signs in last 24 hours: Patient Vitals for the past 24 hrs:  BP Temp Temp src Pulse Resp SpO2  10/08/12 1145 113/68 mmHg 97.9 F (36.6 C) Oral 57  17  96 %  10/08/12 0725 118/60 mmHg 97.8 F (36.6 C) Oral 57  16  95 %  10/08/12 0400 102/49 mmHg 97.9 F (36.6 C) Oral - - 97 %  10/07/12 2335 105/55 mmHg 98 F (36.7 C) Oral - - 95 %  10/07/12 2300 106/26 mmHg - - - - -  10/07/12 2100 124/67 mmHg 98.6 F (37 C) Oral - - -  10/07/12 2024 - 98.6 F (37 C) Oral - - 98 %  10/07/12 1900 109/67 mmHg - - - - -  10/07/12 1800 123/81 mmHg - - - - -  10/07/12 1700 98/49 mmHg - - - - -  10/07/12 1603 - 98.3 F (36.8 C) Oral - 18  99 %  10/07/12 1600 96/67 mmHg - - - - -  10/07/12 1500 92/42 mmHg - - - - -  10/07/12 1400 112/54 mmHg - - - - -    Filed Weights   10/04/12 0252 10/05/12 0622  Weight: 130 lb 8 oz (59.194 kg) 130 lb 1.1 oz (59 kg)    Hemodynamic parameters for last 24 hours:    Intake/Output from previous day: 01/12 0701 - 01/13 0700 In: 627.8 [P.O.:240; I.V.:387.8] Out: -  Intake/Output this shift:    Scheduled Meds:   . aminocaproic acid (AMICAR) for OHS   Intravenous To OR  . aspirin EC  81 mg Oral Daily  . atorvastatin  80 mg Oral q1800  . cefUROXime (ZINACEF)  IV  1.5 g Intravenous To OR  . cefUROXime (ZINACEF)  IV  750 mg Intravenous To OR  . chlorhexidine  60 mL Topical Once  . dexmedetomidine  0.1-0.7 mcg/kg/hr Intravenous To OR  . DOPamine  2-20 mcg/kg/min Intravenous To OR  . epinephrine  0.5-20 mcg/min Intravenous To OR  . insulin (NOVOLIN-R) infusion   Intravenous To OR  . magnesium sulfate   40 mEq Other To OR  . metoprolol tartrate  12.5 mg Oral Once  . metoprolol tartrate  25 mg Oral BID  . nitroGLYCERIN  2-200 mcg/min Intravenous To OR  . nitroglycerin-verapamil-HEPARIN-sodium bicarbonate irrigation for artery spasm   Irrigation To OR  . pantoprazole  40 mg Oral Daily  . phenylephrine (NEO-SYNEPHRINE) Adult infusion  30-200 mcg/min Intravenous To OR  . potassium chloride  80 mEq Other To OR  . vancomycin  1,250 mg Intravenous To OR   Continuous Infusions:   . sodium chloride 10 mL/hr at 10/07/12 0600  . heparin 700 Units/hr (10/07/12 0600)  . nitroGLYCERIN 5 mcg/min (10/07/12 0950)   PRN Meds:.acetaminophen, ALPRAZolam, nitroGLYCERIN, ondansetron (ZOFRAN) IV, temazepam  General appearance: alert and cooperative Neurologic: intact Heart: regular rate and rhythm, S1, S2 normal, no murmur, click, rub or gallop and normal apical impulse  Lungs: clear to auscultation bilaterally and normal percussion bilaterally Abdomen: soft, non-tender; bowel sounds normal; no masses,  no organomegaly Extremities: extremities normal, atraumatic, no cyanosis or edema and Homans sign is negative, no sign of DVT  Lab Results: CBC: Basename 10/08/12 0530 10/07/12 0510  WBC 4.3 4.2  HGB 11.1* 11.1*  HCT 33.7* 34.8*  PLT 225 235   BMET:  Basename 10/08/12 0530 10/07/12 0510  NA 138 138  K 3.5 3.7  CL 107 106  CO2 23 22  GLUCOSE 93 95  BUN 16 15  CREATININE 0.66 0.65  CALCIUM 8.6 8.7    PT/INR: No results found for this basename: LABPROT,INR in the last 72 hours   Radiology No results found.   Assessment/Plan: S/P Procedure(s) (LRB): LEFT HEART CATHETERIZATION WITH CORONARY ANGIOGRAM (N/A)  Plan CABG in am. The goals risks and alternatives of the planned surgical procedure CABG have been discussed with the patient in detail. The risks of the procedure including death, infection, stroke, myocardial infarction, bleeding, blood transfusion have all been discussed  specifically.  I have quoted Abigail Solis a 3 % of perioperative mortality and a complication rate as high as 20 %. The patient's questions have been answered.Scarlet SHERITA DECOSTE is willing  to proceed with the planned procedure.   Delight Ovens MD 10/08/2012 1:25 PM

## 2012-10-08 NOTE — Progress Notes (Signed)
Patient ID: Abigail Solis, female   DOB: 09/14/1941, 72 y.o.   MRN: [id number]    SUBJECTIVE: No chest pain off nitro     . aspirin EC  81 mg Oral Daily  . atorvastatin  80 mg Oral q1800  . metoprolol tartrate  25 mg Oral BID  . pantoprazole  40 mg Oral Daily      Filed Vitals:   10/07/12 2300 10/07/12 2335 10/08/12 0400 10/08/12 0725  BP: 106/26 105/55 102/49 118/60  Pulse:    57  Temp:  98 F (36.7 C) 97.9 F (36.6 C) 97.8 F (36.6 C)  TempSrc:  Oral Oral Oral  Resp:    16  Height:      Weight:      SpO2:  95% 97% 95%    Intake/Output Summary (Last 24 hours) at 10/08/12 0940 Last data filed at 10/08/12 0600  Gross per 24 hour  Intake 576.75 ml  Output      0 ml  Net 576.75 ml    LABS: Basic Metabolic Panel:  Basename 10/08/12 0530 10/07/12 0510  NA 138 138  K 3.5 3.7  CL 107 106  CO2 23 22  GLUCOSE 93 95  BUN 16 15  CREATININE 0.66 0.65  CALCIUM 8.6 8.7  MG -- --  PHOS -- --   CBC:  Basename 10/08/12 0530 10/07/12 0510  WBC 4.3 4.2  NEUTROABS -- --  HGB 11.1* 11.1*  HCT 33.7* 34.8*  MCV 88.7 89.5  PLT 225 235   Cardiac Enzymes:  Basename 10/07/12 0910 10/06/12 1642 10/06/12 0915  CKTOTAL -- -- --  CKMB -- -- --  CKMBINDEX -- -- --  TROPONINI <0.30 <0.30 <0.30    RADIOLOGY: Dg Chest 2 View  10/03/2012  *RADIOLOGY REPORT*  Clinical Data: Hypertension and tachycardia.  CHEST - 2 VIEW  Comparison: None.  Findings: Lungs are clear.  Heart size is normal.  No pneumothorax or pleural fluid.  IMPRESSION: No acute disease.   Original Report Authenticated By: Holley Dexter, M.D.     PHYSICAL EXAM General: NAD Neck: No JVD, no thyromegaly or thyroid nodule.  Lungs: Clear to auscultation bilaterally with normal respiratory effort. CV: Nondisplaced PMI.  Heart regular S1/S2, no S3/S4, no murmur.  No peripheral edema.  No carotid bruit.  Normal pedal pulses.  Abdomen: Soft, nontender, no hepatosplenomegaly, no distention.  Neurologic: Alert  and oriented x 3.  Psych: Normal affect. Extremities: No clubbing or cyanosis.   TELEMETRY: Reviewed telemetry pt in NSR 10/08/2012   ASSESSMENT AND PLAN:  72 yo presented with NSTEMI, had left heart cath showing severe LAD and RCA disease.  Plan for CABG on Tuesday.  She had another episode of mild CP Sunday .  Cardiac enzymes were negative yesterday.  - Continue ASA, heparin gtt, statin, metoprolol - Add NTG gtt  Back if recurrent pain - Stable for CABG unable to do today   Charlton Haws 10/08/2012 9:40 AM

## 2012-10-09 ENCOUNTER — Inpatient Hospital Stay (HOSPITAL_COMMUNITY): Payer: Medicare Other | Admitting: Anesthesiology

## 2012-10-09 ENCOUNTER — Inpatient Hospital Stay (HOSPITAL_COMMUNITY): Payer: Medicare Other

## 2012-10-09 ENCOUNTER — Encounter (HOSPITAL_COMMUNITY)
Admission: EM | Disposition: A | Discharge status: Home / Self Care | Payer: Self-pay | Source: Home / Self Care | Attending: Cardiovascular Disease

## 2012-10-09 ENCOUNTER — Encounter (HOSPITAL_COMMUNITY): Payer: Self-pay | Admitting: Anesthesiology

## 2012-10-09 ENCOUNTER — Encounter (HOSPITAL_COMMUNITY): Payer: Self-pay | Admitting: Certified Registered Nurse Anesthetist

## 2012-10-09 DIAGNOSIS — I2510 Atherosclerotic heart disease of native coronary artery without angina pectoris: Secondary | ICD-10-CM

## 2012-10-09 HISTORY — PX: CORONARY ARTERY BYPASS GRAFT: SHX141

## 2012-10-09 HISTORY — PX: INTRAOPERATIVE TRANSESOPHAGEAL ECHOCARDIOGRAM: SHX5062

## 2012-10-09 LAB — PROTIME-INR
INR: 1.43 (ref 0.00–1.49)
Prothrombin Time: 17.1 seconds — ABNORMAL HIGH (ref 11.6–15.2)

## 2012-10-09 LAB — HEMOGLOBIN AND HEMATOCRIT, BLOOD
HCT: 26.9 % — ABNORMAL LOW (ref 36.0–46.0)
Hemoglobin: 9.2 g/dL — ABNORMAL LOW (ref 12.0–15.0)

## 2012-10-09 LAB — POCT I-STAT 4, (NA,K, GLUC, HGB,HCT)
Glucose, Bld: 103 mg/dL — ABNORMAL HIGH (ref 70–99)
HCT: 29 % — ABNORMAL LOW (ref 36.0–46.0)
HCT: 21 % — ABNORMAL LOW (ref 36.0–46.0)
HCT: 26 % — ABNORMAL LOW (ref 36.0–46.0)
Hemoglobin: 9.9 g/dL — ABNORMAL LOW (ref 12.0–15.0)
Hemoglobin: 10.2 g/dL — ABNORMAL LOW (ref 12.0–15.0)
Hemoglobin: 8.8 g/dL — ABNORMAL LOW (ref 12.0–15.0)
Hemoglobin: 10.5 g/dL — ABNORMAL LOW (ref 12.0–15.0)
Potassium: 3.2 mEq/L — ABNORMAL LOW (ref 3.5–5.1)
Potassium: 3.5 mEq/L (ref 3.5–5.1)
Potassium: 3.4 mEq/L — ABNORMAL LOW (ref 3.5–5.1)
Potassium: 4.2 mEq/L (ref 3.5–5.1)
Potassium: 3.3 mEq/L — ABNORMAL LOW (ref 3.5–5.1)
Sodium: 141 mEq/L (ref 135–145)
Sodium: 136 mEq/L (ref 135–145)
Sodium: 137 mEq/L (ref 135–145)
Sodium: 141 mEq/L (ref 135–145)

## 2012-10-09 LAB — POCT I-STAT 3, ART BLOOD GAS (G3+)
Acid-base deficit: 3 mmol/L — ABNORMAL HIGH (ref 0.0–2.0)
Acid-base deficit: 3 mmol/L — ABNORMAL HIGH (ref 0.0–2.0)
Acid-base deficit: 5 mmol/L — ABNORMAL HIGH (ref 0.0–2.0)
Bicarbonate: 23.7 mEq/L (ref 20.0–24.0)
Bicarbonate: 19.6 mEq/L — ABNORMAL LOW (ref 20.0–24.0)
O2 Saturation: 100 %
O2 Saturation: 97 %
O2 Saturation: 98 %
Patient temperature: 34.5
TCO2: 20 mmol/L (ref 0–100)
pCO2 arterial: 34 mmHg — ABNORMAL LOW (ref 35.0–45.0)
pO2, Arterial: 295 mmHg — ABNORMAL HIGH (ref 80.0–100.0)
pO2, Arterial: 71 mmHg — ABNORMAL LOW (ref 80.0–100.0)

## 2012-10-09 LAB — BASIC METABOLIC PANEL
Calcium: 8.8 mg/dL (ref 8.4–10.5)
Chloride: 105 mEq/L (ref 96–112)
Creatinine, Ser: 0.67 mg/dL (ref 0.50–1.10)
GFR calc Af Amer: >90 mL/min (ref >90)

## 2012-10-09 LAB — CBC
HCT: 30.1 % — ABNORMAL LOW (ref 36.0–46.0)
Hemoglobin: 10.4 g/dL — ABNORMAL LOW (ref 12.0–15.0)
MCH: 28.7 pg (ref 26.0–34.0)
MCH: 29.1 pg (ref 26.0–34.0)
MCHC: 34.6 g/dL (ref 30.0–36.0)
MCHC: 34.6 g/dL (ref 30.0–36.0)
MCV: 88.2 fL (ref 78.0–100.0)
MCV: 84.1 fL (ref 78.0–100.0)
Platelets: 217 10*3/uL (ref 150–400)
Platelets: 69 10*3/uL — ABNORMAL LOW (ref 150–400)
Platelets: 86 10*3/uL — ABNORMAL LOW (ref 150–400)
RBC: 3.58 MIL/uL — ABNORMAL LOW (ref 3.87–5.11)
RDW: 13.4 % (ref 11.5–15.5)
RDW: 14.8 % (ref 11.5–15.5)
RDW: 15.2 % (ref 11.5–15.5)
WBC: 4.4 10*3/uL (ref 4.0–10.5)
WBC: 8 10*3/uL (ref 4.0–10.5)

## 2012-10-09 LAB — GLUCOSE, CAPILLARY
Glucose-Capillary: 106 mg/dL — ABNORMAL HIGH (ref 70–99)
Glucose-Capillary: 121 mg/dL — ABNORMAL HIGH (ref 70–99)
Glucose-Capillary: 117 mg/dL — ABNORMAL HIGH (ref 70–99)
Glucose-Capillary: 110 mg/dL — ABNORMAL HIGH (ref 70–99)

## 2012-10-09 LAB — CREATININE, SERUM
Creatinine, Ser: 0.6 mg/dL (ref 0.50–1.10)
GFR calc Af Amer: >90 mL/min (ref >90)
GFR calc non Af Amer: 90 mL/min — ABNORMAL LOW (ref >90)

## 2012-10-09 LAB — POCT I-STAT, CHEM 8
BUN: 9 mg/dL (ref 6–23)
Creatinine, Ser: 0.6 mg/dL (ref 0.50–1.10)
Glucose, Bld: 121 mg/dL — ABNORMAL HIGH (ref 70–99)
Hemoglobin: 9.9 g/dL — ABNORMAL LOW (ref 12.0–15.0)
Sodium: 141 mEq/L (ref 135–145)
TCO2: 20 mmol/L (ref 0–100)

## 2012-10-09 LAB — PLATELET COUNT: Platelets: 100 10*3/uL — ABNORMAL LOW (ref 150–400)

## 2012-10-09 LAB — SURGICAL PCR SCREEN: MRSA, PCR: NEGATIVE

## 2012-10-09 LAB — MAGNESIUM: Magnesium: 3.8 mg/dL — ABNORMAL HIGH (ref 1.5–2.5)

## 2012-10-09 SURGERY — CORONARY ARTERY BYPASS GRAFTING (CABG)
Anesthesia: General | Site: Chest | Wound class: Clean

## 2012-10-09 MED ORDER — SODIUM CHLORIDE 0.45 % IV SOLN
INTRAVENOUS | Status: DC
  Administered 2012-10-09: 20 mL/h via INTRAVENOUS
  Administered 2012-10-11: 02:00:00 via INTRAVENOUS

## 2012-10-09 MED ORDER — HEMOSTATIC AGENTS (NO CHARGE) OPTIME
TOPICAL | Status: DC | PRN
  Administered 2012-10-09: 1 via TOPICAL

## 2012-10-09 MED ORDER — MORPHINE SULFATE 2 MG/ML IJ SOLN
2.0000 mg | INTRAMUSCULAR | Status: DC | PRN
  Administered 2012-10-09: 4 mg via INTRAVENOUS
  Administered 2012-10-10 – 2012-10-11 (×7): 2 mg via INTRAVENOUS
  Filled 2012-10-09 (×7): qty 1

## 2012-10-09 MED ORDER — BISACODYL 10 MG RE SUPP: 10.0000 mg | Freq: Every day | RECTAL | Status: DC

## 2012-10-09 MED ORDER — METOPROLOL TARTRATE 12.5 MG HALF TABLET
12.5000 mg | ORAL_TABLET | Freq: Two times a day (BID) | ORAL | Status: DC
  Filled 2012-10-09 (×5): qty 1

## 2012-10-09 MED ORDER — LACTATED RINGERS IV SOLN
INTRAVENOUS | Status: DC | PRN
  Administered 2012-10-09: 07:00:00 via INTRAVENOUS

## 2012-10-09 MED ORDER — MORPHINE SULFATE 2 MG/ML IJ SOLN: 1.0000 mg | INTRAMUSCULAR | Status: AC | PRN

## 2012-10-09 MED ORDER — DEXMEDETOMIDINE HCL IN NACL 200 MCG/50ML IV SOLN: 0.1000 ug/kg/h | INTRAVENOUS | Status: DC

## 2012-10-09 MED ORDER — MIDAZOLAM HCL 5 MG/5ML IJ SOLN
INTRAMUSCULAR | Status: DC | PRN
  Administered 2012-10-09 (×2): 2 mg via INTRAVENOUS
  Administered 2012-10-09: 1 mg via INTRAVENOUS
  Administered 2012-10-09: 3 mg via INTRAVENOUS
  Administered 2012-10-09: 2 mg via INTRAVENOUS

## 2012-10-09 MED ORDER — OXYCODONE HCL 5 MG PO TABS: 5.0000 mg | ORAL_TABLET | ORAL | Status: DC | PRN

## 2012-10-09 MED ORDER — DOPAMINE-DEXTROSE 3.2-5 MG/ML-% IV SOLN
0.0000 ug/kg/min | INTRAVENOUS | Status: DC
  Administered 2012-10-11: 3 ug/kg/min via INTRAVENOUS
  Filled 2012-10-09: qty 250

## 2012-10-09 MED ORDER — ALBUMIN HUMAN 5 % IV SOLN
250.0000 mL | INTRAVENOUS | Status: AC | PRN
  Administered 2012-10-09 – 2012-10-10 (×4): 250 mL via INTRAVENOUS
  Filled 2012-10-09 (×2): qty 250

## 2012-10-09 MED ORDER — FAMOTIDINE IN NACL 20-0.9 MG/50ML-% IV SOLN
20.0000 mg | Freq: Two times a day (BID) | INTRAVENOUS | Status: AC
  Administered 2012-10-09: 20 mg via INTRAVENOUS

## 2012-10-09 MED ORDER — PROTAMINE SULFATE 10 MG/ML IV SOLN
INTRAVENOUS | Status: DC | PRN
  Administered 2012-10-09: 150 mg via INTRAVENOUS

## 2012-10-09 MED ORDER — HEPARIN SODIUM (PORCINE) 1000 UNIT/ML IJ SOLN
INTRAMUSCULAR | Status: DC | PRN
  Administered 2012-10-09: 18000 [IU] via INTRAVENOUS

## 2012-10-09 MED ORDER — MAGNESIUM SULFATE 40 MG/ML IJ SOLN
4.0000 g | Freq: Once | INTRAMUSCULAR | Status: AC
  Administered 2012-10-09: 4 g via INTRAVENOUS
  Filled 2012-10-09: qty 100

## 2012-10-09 MED ORDER — POTASSIUM CHLORIDE 10 MEQ/50ML IV SOLN
10.0000 meq | INTRAVENOUS | Status: AC
  Administered 2012-10-09 (×3): 10 meq via INTRAVENOUS

## 2012-10-09 MED ORDER — SODIUM CHLORIDE 0.9 % IJ SOLN: 3.0000 mL | INTRAMUSCULAR | Status: DC | PRN

## 2012-10-09 MED ORDER — METOPROLOL TARTRATE 1 MG/ML IV SOLN: 2.5000 mg | INTRAVENOUS | Status: DC | PRN

## 2012-10-09 MED ORDER — LIDOCAINE HCL (CARDIAC) 20 MG/ML IV SOLN
INTRAVENOUS | Status: DC | PRN
  Administered 2012-10-09: 65 mg via INTRAVENOUS

## 2012-10-09 MED ORDER — NITROGLYCERIN IN D5W 200-5 MCG/ML-% IV SOLN: 0.0000 ug/min | INTRAVENOUS | Status: DC

## 2012-10-09 MED ORDER — PANTOPRAZOLE SODIUM 40 MG PO TBEC: 40.0000 mg | DELAYED_RELEASE_TABLET | Freq: Every day | ORAL | Status: DC

## 2012-10-09 MED ORDER — SODIUM CHLORIDE 0.9 % IJ SOLN
OROMUCOSAL | Status: DC | PRN
  Administered 2012-10-09 (×3): via TOPICAL

## 2012-10-09 MED ORDER — 0.9 % SODIUM CHLORIDE (POUR BTL) OPTIME
TOPICAL | Status: DC | PRN
  Administered 2012-10-09: 6000 mL

## 2012-10-09 MED ORDER — MIDAZOLAM HCL 2 MG/2ML IJ SOLN
2.0000 mg | INTRAMUSCULAR | Status: DC | PRN
  Administered 2012-10-09: 2 mg via INTRAVENOUS
  Filled 2012-10-09: qty 2

## 2012-10-09 MED ORDER — ACETAMINOPHEN 500 MG PO TABS
1000.0000 mg | ORAL_TABLET | Freq: Four times a day (QID) | ORAL | Status: DC
  Administered 2012-10-10 (×3): 1000 mg via ORAL
  Filled 2012-10-09 (×9): qty 2

## 2012-10-09 MED ORDER — METOPROLOL TARTRATE 25 MG/10 ML ORAL SUSPENSION
12.5000 mg | Freq: Two times a day (BID) | ORAL | Status: DC
  Filled 2012-10-09 (×5): qty 5

## 2012-10-09 MED ORDER — INSULIN ASPART 100 UNIT/ML ~~LOC~~ SOLN
0.0000 [IU] | SUBCUTANEOUS | Status: AC
  Administered 2012-10-09: 2 [IU] via SUBCUTANEOUS
  Administered 2012-10-09: 4 [IU] via SUBCUTANEOUS
  Administered 2012-10-10: 2 [IU] via SUBCUTANEOUS

## 2012-10-09 MED ORDER — ARTIFICIAL TEARS OP OINT
TOPICAL_OINTMENT | OPHTHALMIC | Status: DC | PRN
  Administered 2012-10-09: 1 via OPHTHALMIC

## 2012-10-09 MED ORDER — ONDANSETRON HCL 4 MG/2ML IJ SOLN
4.0000 mg | Freq: Four times a day (QID) | INTRAMUSCULAR | Status: DC | PRN
  Administered 2012-10-09 – 2012-10-11 (×3): 4 mg via INTRAVENOUS
  Filled 2012-10-09 (×3): qty 2

## 2012-10-09 MED ORDER — ALBUMIN HUMAN 5 % IV SOLN
INTRAVENOUS | Status: DC | PRN
  Administered 2012-10-09 (×2): via INTRAVENOUS

## 2012-10-09 MED ORDER — SODIUM CHLORIDE 0.9 % IJ SOLN
3.0000 mL | Freq: Two times a day (BID) | INTRAMUSCULAR | Status: DC
  Administered 2012-10-10: 6 mL via INTRAVENOUS
  Administered 2012-10-11: 3 mL via INTRAVENOUS

## 2012-10-09 MED ORDER — LACTATED RINGERS IV SOLN
INTRAVENOUS | Status: DC | PRN
  Administered 2012-10-09 (×2): via INTRAVENOUS

## 2012-10-09 MED ORDER — MORPHINE SULFATE 2 MG/ML IJ SOLN
INTRAMUSCULAR | Status: AC
  Filled 2012-10-09: qty 2

## 2012-10-09 MED ORDER — SODIUM CHLORIDE 0.9 % IV SOLN: 250.0000 mL | INTRAVENOUS | Status: DC | PRN

## 2012-10-09 MED ORDER — LACTATED RINGERS IV SOLN
INTRAVENOUS | Status: DC
  Administered 2012-10-09 (×2): 20 mL/h via INTRAVENOUS

## 2012-10-09 MED ORDER — VANCOMYCIN HCL IN DEXTROSE 1-5 GM/200ML-% IV SOLN
1000.0000 mg | Freq: Once | INTRAVENOUS | Status: AC
  Administered 2012-10-09: 1000 mg via INTRAVENOUS
  Filled 2012-10-09: qty 200

## 2012-10-09 MED ORDER — SODIUM CHLORIDE 0.9 % IV SOLN
INTRAVENOUS | Status: DC
  Administered 2012-10-09 (×3): 20 mL/h via INTRAVENOUS

## 2012-10-09 MED ORDER — LACTATED RINGERS IV SOLN: 500.0000 mL | Freq: Once | INTRAVENOUS | Status: AC | PRN

## 2012-10-09 MED ORDER — INSULIN REGULAR BOLUS VIA INFUSION
0.0000 [IU] | Freq: Three times a day (TID) | INTRAVENOUS | Status: DC
  Filled 2012-10-09: qty 10

## 2012-10-09 MED ORDER — SODIUM CHLORIDE 0.9 % IV SOLN
INTRAVENOUS | Status: DC
  Filled 2012-10-09 (×2): qty 40

## 2012-10-09 MED ORDER — FENTANYL CITRATE 0.05 MG/ML IJ SOLN
INTRAMUSCULAR | Status: DC | PRN
  Administered 2012-10-09: 125 ug via INTRAVENOUS
  Administered 2012-10-09: 100 ug via INTRAVENOUS
  Administered 2012-10-09 (×2): 350 ug via INTRAVENOUS
  Administered 2012-10-09: 50 ug via INTRAVENOUS
  Administered 2012-10-09: 150 ug via INTRAVENOUS
  Administered 2012-10-09: 250 ug via INTRAVENOUS
  Administered 2012-10-09: 125 ug via INTRAVENOUS

## 2012-10-09 MED ORDER — ACETAMINOPHEN 160 MG/5ML PO SOLN: 975.0000 mg | Freq: Four times a day (QID) | ORAL | Status: DC

## 2012-10-09 MED ORDER — BISACODYL 5 MG PO TBEC
10.0000 mg | DELAYED_RELEASE_TABLET | Freq: Every day | ORAL | Status: DC
  Administered 2012-10-10: 10 mg via ORAL
  Filled 2012-10-09: qty 2

## 2012-10-09 MED ORDER — DEXTROSE 5 % IV SOLN
1.5000 g | Freq: Two times a day (BID) | INTRAVENOUS | Status: DC
  Administered 2012-10-09 – 2012-10-10 (×3): 1.5 g via INTRAVENOUS
  Filled 2012-10-09 (×4): qty 1.5

## 2012-10-09 MED ORDER — DOCUSATE SODIUM 100 MG PO CAPS
200.0000 mg | ORAL_CAPSULE | Freq: Every day | ORAL | Status: DC
  Administered 2012-10-10: 200 mg via ORAL

## 2012-10-09 MED ORDER — SODIUM CHLORIDE 0.9 % IV SOLN
INTRAVENOUS | Status: DC
  Administered 2012-10-09: 1.2 [IU]/h via INTRAVENOUS
  Administered 2012-10-09: 1 [IU]/h via INTRAVENOUS
  Administered 2012-10-09: 1.1 [IU]/h via INTRAVENOUS
  Administered 2012-10-09: 1 [IU]/h via INTRAVENOUS
  Filled 2012-10-09: qty 1

## 2012-10-09 MED ORDER — PROPOFOL 10 MG/ML IV BOLUS
INTRAVENOUS | Status: DC | PRN
  Administered 2012-10-09 (×2): 40 mg via INTRAVENOUS
  Administered 2012-10-09: 50 mg via INTRAVENOUS
  Administered 2012-10-09: 70 mg via INTRAVENOUS
  Administered 2012-10-09: 40 mg via INTRAVENOUS

## 2012-10-09 MED ORDER — POTASSIUM CHLORIDE 10 MEQ/50ML IV SOLN
10.0000 meq | INTRAVENOUS | Status: AC
  Administered 2012-10-09 (×2): 10 meq via INTRAVENOUS

## 2012-10-09 MED ORDER — ACETAMINOPHEN 10 MG/ML IV SOLN
1000.0000 mg | Freq: Once | INTRAVENOUS | Status: AC
  Administered 2012-10-09: 1000 mg via INTRAVENOUS
  Filled 2012-10-09: qty 100

## 2012-10-09 MED ORDER — PHENYLEPHRINE HCL 10 MG/ML IJ SOLN
0.0000 ug/min | INTRAVENOUS | Status: DC
  Filled 2012-10-09: qty 2

## 2012-10-09 MED ORDER — ROCURONIUM BROMIDE 100 MG/10ML IV SOLN
INTRAVENOUS | Status: DC | PRN
  Administered 2012-10-09: 50 mg via INTRAVENOUS
  Administered 2012-10-09: 30 mg via INTRAVENOUS
  Administered 2012-10-09: 50 mg via INTRAVENOUS
  Administered 2012-10-09: 20 mg via INTRAVENOUS

## 2012-10-09 MED ORDER — INSULIN ASPART 100 UNIT/ML ~~LOC~~ SOLN: 0.0000 [IU] | SUBCUTANEOUS | Status: DC

## 2012-10-09 SURGICAL SUPPLY — 112 items
ADH SKN CLS APL DERMABOND .7 (GAUZE/BANDAGES/DRESSINGS) ×1
ATTRACTOMAT 16X20 MAGNETIC DRP (DRAPES) ×2 IMPLANT
BAG DECANTER FOR FLEXI CONT (MISCELLANEOUS) ×2 IMPLANT
BANDAGE ELASTIC 4 VELCRO ST LF (GAUZE/BANDAGES/DRESSINGS) ×3 IMPLANT
BANDAGE ELASTIC 6 VELCRO ST LF (GAUZE/BANDAGES/DRESSINGS) ×3 IMPLANT
BANDAGE GAUZE ELAST BULKY 4 IN (GAUZE/BANDAGES/DRESSINGS) ×3 IMPLANT
BLADE STERNUM SYSTEM 6 (BLADE) ×2 IMPLANT
BLADE SURG 11 STRL SS (BLADE) ×1 IMPLANT
BLADE SURG ROTATE 9660 (MISCELLANEOUS) IMPLANT
CANISTER SUCTION 2500CC (MISCELLANEOUS) ×2 IMPLANT
CANN PRFSN .5XCNCT 15X34-48 (MISCELLANEOUS) ×1
CANNULA AORTIC HI-FLOW 6.5M20F (CANNULA) ×2 IMPLANT
CANNULA PRFSN .5XCNCT 15X34-48 (MISCELLANEOUS) ×1 IMPLANT
CANNULA VEN 2 STAGE (MISCELLANEOUS) ×3 IMPLANT
CATH CPB KIT GERHARDT (MISCELLANEOUS) ×2 IMPLANT
CATH THORACIC 28FR (CATHETERS) ×2 IMPLANT
CATH THORACIC 36FR (CATHETERS) IMPLANT
CATH THORACIC 36FR RT ANG (CATHETERS) IMPLANT
CLIP TI MEDIUM 24 (CLIP) IMPLANT
CLIP TI WIDE RED SMALL 24 (CLIP) IMPLANT
CLOTH BEACON ORANGE TIMEOUT ST (SAFETY) ×2 IMPLANT
COVER SURGICAL LIGHT HANDLE (MISCELLANEOUS) ×3 IMPLANT
CRADLE DONUT ADULT HEAD (MISCELLANEOUS) ×2 IMPLANT
DERMABOND ADVANCED (GAUZE/BANDAGES/DRESSINGS) ×1
DERMABOND ADVANCED .7 DNX12 (GAUZE/BANDAGES/DRESSINGS) IMPLANT
DRAIN CHANNEL 28F RND 3/8 FF (WOUND CARE) ×2 IMPLANT
DRAPE CARDIOVASCULAR INCISE (DRAPES) ×2
DRAPE SLUSH/WARMER DISC (DRAPES) ×2 IMPLANT
DRAPE SRG 135X102X78XABS (DRAPES) ×1 IMPLANT
DRSG COVADERM 4X14 (GAUZE/BANDAGES/DRESSINGS) ×1 IMPLANT
ELECT BLADE 4.0 EZ CLEAN MEGAD (MISCELLANEOUS) ×2
ELECT CAUTERY BLADE 6.4 (BLADE) ×2 IMPLANT
ELECT REM PT RETURN 9FT ADLT (ELECTROSURGICAL) ×4
ELECTRODE BLDE 4.0 EZ CLN MEGD (MISCELLANEOUS) ×1 IMPLANT
ELECTRODE REM PT RTRN 9FT ADLT (ELECTROSURGICAL) ×2 IMPLANT
GLOVE BIO SURGEON STRL SZ 6 (GLOVE) ×4 IMPLANT
GLOVE BIO SURGEON STRL SZ 6.5 (GLOVE) ×11 IMPLANT
GLOVE BIO SURGEON STRL SZ7 (GLOVE) IMPLANT
GLOVE BIO SURGEON STRL SZ7.5 (GLOVE) ×1 IMPLANT
GLOVE BIOGEL PI IND STRL 6 (GLOVE) IMPLANT
GLOVE BIOGEL PI IND STRL 6.5 (GLOVE) IMPLANT
GLOVE BIOGEL PI IND STRL 7.0 (GLOVE) IMPLANT
GLOVE BIOGEL PI INDICATOR 6 (GLOVE)
GLOVE BIOGEL PI INDICATOR 6.5 (GLOVE) ×6
GLOVE BIOGEL PI INDICATOR 7.0 (GLOVE)
GOWN STRL NON-REIN LRG LVL3 (GOWN DISPOSABLE) ×14 IMPLANT
HEMOSTAT POWDER SURGIFOAM 1G (HEMOSTASIS) ×6 IMPLANT
HEMOSTAT SURGICEL .5X2 ABSORB (HEMOSTASIS) ×1 IMPLANT
HEMOSTAT SURGICEL 2X14 (HEMOSTASIS) ×2 IMPLANT
INSERT FOGARTY 61MM (MISCELLANEOUS) IMPLANT
INSERT FOGARTY XLG (MISCELLANEOUS) IMPLANT
KIT BASIN OR (CUSTOM PROCEDURE TRAY) ×2 IMPLANT
KIT ROOM TURNOVER OR (KITS) ×2 IMPLANT
KIT SUCTION CATH 14FR (SUCTIONS) ×4 IMPLANT
KIT VASOVIEW W/TROCAR VH 2000 (KITS) ×2 IMPLANT
LEAD PACING MYOCARDI (MISCELLANEOUS) ×2 IMPLANT
MARKER GRAFT CORONARY BYPASS (MISCELLANEOUS) ×6 IMPLANT
NS IRRIG 1000ML POUR BTL (IV SOLUTION) ×11 IMPLANT
PACK OPEN HEART (CUSTOM PROCEDURE TRAY) ×2 IMPLANT
PAD ARMBOARD 7.5X6 YLW CONV (MISCELLANEOUS) ×4 IMPLANT
PENCIL BUTTON HOLSTER BLD 10FT (ELECTRODE) ×2 IMPLANT
PUNCH AORTIC ROTATE 4.0MM (MISCELLANEOUS) ×1 IMPLANT
PUNCH AORTIC ROTATE 4.5MM 8IN (MISCELLANEOUS) IMPLANT
PUNCH AORTIC ROTATE 5MM 8IN (MISCELLANEOUS) IMPLANT
SET CARDIOPLEGIA MPS 5001102 (MISCELLANEOUS) ×1 IMPLANT
SOLUTION ANTI FOG 6CC (MISCELLANEOUS) IMPLANT
SPONGE GAUZE 4X4 12PLY (GAUZE/BANDAGES/DRESSINGS) ×4 IMPLANT
SPONGE LAP 18X18 X RAY DECT (DISPOSABLE) ×3 IMPLANT
SPONGE LAP 4X18 X RAY DECT (DISPOSABLE) IMPLANT
SUT BONE WAX W31G (SUTURE) ×2 IMPLANT
SUT MNCRL AB 4-0 PS2 18 (SUTURE) ×1 IMPLANT
SUT PROLENE 3 0 SH DA (SUTURE) IMPLANT
SUT PROLENE 3 0 SH1 36 (SUTURE) ×2 IMPLANT
SUT PROLENE 4 0 RB 1 (SUTURE)
SUT PROLENE 4 0 SH DA (SUTURE) IMPLANT
SUT PROLENE 4 0 TF (SUTURE) ×4 IMPLANT
SUT PROLENE 4-0 RB1 .5 CRCL 36 (SUTURE) IMPLANT
SUT PROLENE 5 0 C 1 36 (SUTURE) IMPLANT
SUT PROLENE 6 0 C 1 30 (SUTURE) ×2 IMPLANT
SUT PROLENE 6 0 CC (SUTURE) ×6 IMPLANT
SUT PROLENE 7 0 BV 1 (SUTURE) ×1 IMPLANT
SUT PROLENE 7 0 BV1 MDA (SUTURE) ×2 IMPLANT
SUT PROLENE 7.0 RB 3 (SUTURE) IMPLANT
SUT PROLENE 8 0 BV175 6 (SUTURE) ×3 IMPLANT
SUT SILK  1 MH (SUTURE)
SUT SILK 1 MH (SUTURE) IMPLANT
SUT SILK 2 0 SH CR/8 (SUTURE) IMPLANT
SUT SILK 3 0 SH CR/8 (SUTURE) IMPLANT
SUT STEEL 6MS V (SUTURE) ×2 IMPLANT
SUT STEEL STERNAL CCS#1 18IN (SUTURE) IMPLANT
SUT STEEL SZ 6 DBL 3X14 BALL (SUTURE) ×2 IMPLANT
SUT VIC AB 1 CTX 18 (SUTURE) ×4 IMPLANT
SUT VIC AB 1 CTX 36 (SUTURE)
SUT VIC AB 1 CTX36XBRD ANBCTR (SUTURE) IMPLANT
SUT VIC AB 2-0 CT1 27 (SUTURE) ×6
SUT VIC AB 2-0 CT1 TAPERPNT 27 (SUTURE) IMPLANT
SUT VIC AB 2-0 CTX 27 (SUTURE) IMPLANT
SUT VIC AB 3-0 SH 27 (SUTURE)
SUT VIC AB 3-0 SH 27X BRD (SUTURE) IMPLANT
SUT VIC AB 3-0 X1 27 (SUTURE) IMPLANT
SUT VICRYL 4-0 PS2 18IN ABS (SUTURE) ×2 IMPLANT
SUTURE E-PAK OPEN HEART (SUTURE) ×2 IMPLANT
SYSTEM SAHARA CHEST DRAIN ATS (WOUND CARE) ×2 IMPLANT
TAPE CLOTH SURG 4X10 WHT LF (GAUZE/BANDAGES/DRESSINGS) ×1 IMPLANT
TOWEL OR 17X24 6PK STRL BLUE (TOWEL DISPOSABLE) ×4 IMPLANT
TOWEL OR 17X26 10 PK STRL BLUE (TOWEL DISPOSABLE) ×4 IMPLANT
TRAY FOLEY IC TEMP SENS 14FR (CATHETERS) ×2 IMPLANT
TUBE FEEDING 8FR 16IN STR KANG (MISCELLANEOUS) ×2 IMPLANT
TUBE SUCT INTRACARD DLP 20F (MISCELLANEOUS) ×2 IMPLANT
TUBING INSUFFLATION 10FT LAP (TUBING) ×2 IMPLANT
UNDERPAD 30X30 INCONTINENT (UNDERPADS AND DIAPERS) ×2 IMPLANT
WATER STERILE IRR 1000ML POUR (IV SOLUTION) ×4 IMPLANT

## 2012-10-09 NOTE — OR Nursing (Signed)
2nd call to SICU 

## 2012-10-09 NOTE — OR Nursing (Signed)
1610 Family updated.

## 2012-10-09 NOTE — Transfer of Care (Signed)
Immediate Anesthesia Transfer of Care Note  Patient: Abigail Solis  Procedure(s) Performed: Procedure(s) (LRB) with comments: CORONARY ARTERY BYPASS GRAFTING (CABG) (N/A) - times four using left internal artery and bilateral saphenous vein endoscopically harvested INTRAOPERATIVE TRANSESOPHAGEAL ECHOCARDIOGRAM (N/A)  Patient Location: ICU  Anesthesia Type:General  Level of Consciousness: sedated and unresponsive  Airway & Oxygen Therapy: Ventilated, 100% fiO2, Ventilator setting per RT.  See chart.  Post-op Assessment: Report given to PACU RN and Post -op Vital signs reviewed and stable  Post vital signs: Reviewed and stable--Dr. Randa Evens to BS.  BP = 160-200 / 70-100.  NTG gtt incr to 200 mcg/min.  BP decreasing at satisfactory rate.  Complications: No apparent anesthesia complications

## 2012-10-09 NOTE — OR Nursing (Signed)
1st call to SICU nurse [phone].

## 2012-10-09 NOTE — Care Management (Signed)
Pt ABG within parameters. VC .8, NIF -30. Pt extubated to 4 lt nasal cannula. Insentive reached was 500 X 5. With encouragement. Sats 96, HR 80, RR 19

## 2012-10-09 NOTE — OR Nursing (Signed)
Family update by volunteer 1205.

## 2012-10-09 NOTE — Care Management (Signed)
Pt placed on wean of 4/40%. RR 20

## 2012-10-09 NOTE — Anesthesia Postprocedure Evaluation (Signed)
  Anesthesia Post-op Note  Patient: Abigail Solis  Procedure(s) Performed: Procedure(s) (LRB) with comments: CORONARY ARTERY BYPASS GRAFTING (CABG) (N/A) - times four using left internal artery and bilateral saphenous vein endoscopically harvested INTRAOPERATIVE TRANSESOPHAGEAL ECHOCARDIOGRAM (N/A)  Patient Location: PACU and NICU  Anesthesia Type:General  Level of Consciousness: Patient remains intubated per anesthesia plan  Airway and Oxygen Therapy: Patient remains intubated per anesthesia plan  Post-op Pain: mild  Post-op Assessment: Post-op Vital signs reviewed  Post-op Vital Signs: Reviewed  Complications: No apparent anesthesia complications

## 2012-10-09 NOTE — Anesthesia Preprocedure Evaluation (Addendum)
Anesthesia Evaluation  Patient identified by MRN, date of birth, ID band Patient awake    Reviewed: Allergy & Precautions, H&P , NPO status , Patient's Chart, lab work & pertinent test results  Airway Mallampati: II      Dental   Pulmonary neg pulmonary ROS,  breath sounds clear to auscultation        Cardiovascular + Past MI and +CHF Rhythm:Regular Rate:Normal     Neuro/Psych    GI/Hepatic Neg liver ROS, GERD-  ,  Endo/Other  negative endocrine ROS  Renal/GU negative Renal ROS     Musculoskeletal negative musculoskeletal ROS (+)   Abdominal   Peds  Hematology negative hematology ROS (+)   Anesthesia Other Findings   Reproductive/Obstetrics                          Anesthesia Physical Anesthesia Plan  ASA: III  Anesthesia Plan: General   Post-op Pain Management:    Induction: Intravenous  Airway Management Planned: Oral ETT  Additional Equipment: Arterial line, PA Cath, TEE and Ultrasound Guidance Line Placement  Intra-op Plan:   Post-operative Plan: Post-operative intubation/ventilation  Informed Consent: I have reviewed the patients History and Physical, chart, labs and discussed the procedure including the risks, benefits and alternatives for the proposed anesthesia with the patient or authorized representative who has indicated his/her understanding and acceptance.   Dental advisory given  Plan Discussed with: CRNA, Anesthesiologist and Surgeon  Anesthesia Plan Comments:       Anesthesia Quick Evaluation

## 2012-10-09 NOTE — Progress Notes (Signed)
  Echocardiogram Echocardiogram Transesophageal has been performed.  Abigail Solis 10/09/2012, 10:39 AM

## 2012-10-09 NOTE — Care Management Note (Signed)
    Page 1 of 1   10/12/2012     6:02:21 PM   CARE MANAGEMENT NOTE 10/12/2012  Patient:  Abigail Solis, Abigail Solis   Account Number:  [id number]  Date Initiated:  10/09/2012  Documentation initiated by:  [facility]  Subjective/Objective Assessment:   post op CABG on 1-14 -  PTA, PT INDEPENDENT, LIVES WITH SPOUSE.     Action/Plan:   MET WITH PT AND FAMILY.  FAMILY TO PROVIDE 24HR CARE AT DC. NO DC NEEDS ANTICIPATED.   Anticipated DC Date:  10/12/2012   Anticipated DC Plan:  HOME W HOME HEALTH SERVICES      DC Planning Services  CM consult      Choice offered to / List presented to:             Status of service:  Completed, signed off Medicare Important Message given?   (If response is "NO", the following Medicare IM given date fields will be blank) Date Medicare IM given:   Date Additional Medicare IM given:    Discharge Disposition:  HOME/SELF CARE  Per UR Regulation:  Reviewed for med. necessity/level of care/duration of stay  If discussed at Long Length of Stay Meetings, dates discussed:   10/09/2012    Comments:  Contact:  Bradford,Tara Daughter [phone]                 Hodge,Jerald Spouse [phone]                 Deisher,Todd Son [phone]

## 2012-10-09 NOTE — Progress Notes (Signed)
Pt still too sleepy for 40% 4. Pt placed back on 40% RR 12. RT will check back at later time. RT will continue to monitor.

## 2012-10-09 NOTE — Progress Notes (Signed)
Patient ID: Renne Crigler, female   DOB: [birth year], 72 y.o.   MRN: [id number]                   [address]            [place] [address]          [phone]     Day of Surgery Procedure(s) (LRB): CORONARY ARTERY BYPASS GRAFTING (CABG) (N/A) INTRAOPERATIVE TRANSESOPHAGEAL ECHOCARDIOGRAM (N/A)  Total Length of Stay:  LOS: 6 days  BP 125/72  Pulse 80  Temp 95 F (35 C) (Oral)  Resp 16  Ht 5' (1.524 m)  Wt 130 lb 1.1 oz (59 kg)  BMI 25.40 kg/m2  SpO2 98%  .Intake/Output      01/13 0701 - 01/14 0700 01/14 0701 - 01/15 0700   P.O.     I.V. (mL/kg) 153 (2.6) 2900 (49.2)   Blood  430   NG/GT  30   IV Piggyback  1220   Total Intake(mL/kg) 153 (2.6) 4580 (77.6)   Urine (mL/kg/hr)  3675 (5.4)   Blood  1625   Chest Tube  120   Total Output  5420   Net +153 -840             . sodium chloride 20 mL/hr (10/09/12 1800)  . sodium chloride 20 mL/hr (10/09/12 1619)  . dexmedetomidine Stopped (10/09/12 1730)  . DOPamine Stopped (10/09/12 1438)  . insulin (NOVOLIN-R) infusion 1.1 Units/hr (10/09/12 1808)  . lactated ringers 20 mL/hr (10/09/12 1653)  . nitroGLYCERIN 10 mcg/min (10/09/12 1808)  . phenylephrine (NEO-SYNEPHRINE) Adult infusion Stopped (10/09/12 1400)     Lab Results  Component Value Date   WBC 7.7 10/09/2012   HGB 10.6* 10/09/2012   HGB 10.5* 10/09/2012   HCT 30.6* 10/09/2012   HCT 31.0* 10/09/2012   PLT 69* 10/09/2012   GLUCOSE 99 10/09/2012   CHOL 195 10/04/2012   TRIG 65 10/04/2012   HDL 56 10/04/2012   LDLCALC 629* 10/04/2012   ALT 14 10/04/2012   AST 23 10/04/2012   NA 141 10/09/2012   K 3.3* 10/09/2012   CL 105 10/09/2012   CREATININE 0.67 10/09/2012   BUN 16 10/09/2012   CO2 22 10/09/2012   TSH 2.214 10/04/2012   INR 1.43 10/09/2012   HGBA1C 6.0* 10/04/2012   Remains on ventilator weaning now Not bleeding   Delight Ovens MD  Beeper [phone] Office [phone] 10/09/2012 6:36 PM

## 2012-10-09 NOTE — Brief Op Note (Signed)
10/03/2012 - 10/09/2012  11:31 AM  PATIENT:  Abigail Solis  72 y.o. female  PRE-OPERATIVE DIAGNOSIS:  Coronary Artery Disease  POST-OPERATIVE DIAGNOSIS:  Coronary Artery Disease  PROCEDURE:  INTRAOPERATIVE TRANS ESOPHAGEAL ECHOCARDIOGRAM ,CORONARY ARTERY BYPASS GRAFTING (CABG) x 4 (LIMA to LAD, SVG to DIAGONAL, SVG to CIRCUMFLEX, and SVG to RCA) with EVH from BILATERAL THIGHS   SURGEON:  Surgeon(s) and Role:    * Delight Ovens, MD - Primary  PHYSICIAN ASSISTANT: Doree Fudge PA-C   ANESTHESIA:   general  EBL:  Total I/O In: 1500 [I.V.:1500] Out: 1250 [Urine:1250]  BLOOD ADMINISTERED:2 CC PRBC  DRAINS:  Chest Tube(s) in the Mediastinal and pleural spaces   COUNTS CORRECT:  YES  DICTATION: .Dragon Dictation  PLAN OF CARE: Admit to inpatient   PATIENT DISPOSITION:  ICU - intubated and hemodynamically stable.   Delay start of Pharmacological VTE agent (>24hrs) due to surgical blood loss or risk of bleeding: yes  PRE OP WEIGHT: 59 kg

## 2012-10-09 NOTE — Care Management (Signed)
Pt placed on wean of RR4< FIO2 40%. Pt awake and following commands. Pt lasted 5 min on 4/40 having to be placed back on full support due to RR 2-5.

## 2012-10-09 NOTE — Care Management (Signed)
Pt placed on PS/CPAP 10/5

## 2012-10-10 ENCOUNTER — Inpatient Hospital Stay (HOSPITAL_COMMUNITY): Payer: Medicare Other

## 2012-10-10 LAB — CBC
HCT: 25.7 % — ABNORMAL LOW (ref 36.0–46.0)
Hemoglobin: 8.9 g/dL — ABNORMAL LOW (ref 12.0–15.0)
MCH: 29.3 pg (ref 26.0–34.0)
MCHC: 34.6 g/dL (ref 30.0–36.0)
MCHC: 33.8 g/dL (ref 30.0–36.0)
MCV: 84.5 fL (ref 78.0–100.0)
RDW: 15.2 % (ref 11.5–15.5)
RDW: 15.5 % (ref 11.5–15.5)
WBC: 9.8 10*3/uL (ref 4.0–10.5)

## 2012-10-10 LAB — CREATININE, SERUM
Creatinine, Ser: 0.64 mg/dL (ref 0.50–1.10)
GFR calc Af Amer: >90 mL/min (ref >90)
GFR calc non Af Amer: 88 mL/min — ABNORMAL LOW (ref >90)

## 2012-10-10 LAB — POCT I-STAT, CHEM 8
Calcium, Ion: 1.16 mmol/L (ref 1.13–1.30)
Chloride: 104 mEq/L (ref 96–112)
Glucose, Bld: 125 mg/dL — ABNORMAL HIGH (ref 70–99)
HCT: 30 % — ABNORMAL LOW (ref 36.0–46.0)
Hemoglobin: 10.2 g/dL — ABNORMAL LOW (ref 12.0–15.0)
Potassium: 4 mEq/L (ref 3.5–5.1)

## 2012-10-10 LAB — BASIC METABOLIC PANEL
BUN: 9 mg/dL (ref 6–23)
Creatinine, Ser: 0.59 mg/dL (ref 0.50–1.10)
GFR calc Af Amer: >90 mL/min (ref >90)
GFR calc non Af Amer: >90 mL/min (ref >90)
Glucose, Bld: 89 mg/dL (ref 70–99)
Potassium: 3.4 mEq/L — ABNORMAL LOW (ref 3.5–5.1)

## 2012-10-10 LAB — GLUCOSE, CAPILLARY
Glucose-Capillary: 84 mg/dL (ref 70–99)
Glucose-Capillary: 105 mg/dL — ABNORMAL HIGH (ref 70–99)
Glucose-Capillary: 111 mg/dL — ABNORMAL HIGH (ref 70–99)
Glucose-Capillary: 131 mg/dL — ABNORMAL HIGH (ref 70–99)

## 2012-10-10 LAB — MAGNESIUM: Magnesium: 2.3 mg/dL (ref 1.5–2.5)

## 2012-10-10 MED ORDER — POTASSIUM CHLORIDE 10 MEQ/50ML IV SOLN
10.0000 meq | INTRAVENOUS | Status: AC
  Administered 2012-10-10 (×3): 10 meq via INTRAVENOUS
  Filled 2012-10-10: qty 150

## 2012-10-10 MED ORDER — INSULIN ASPART 100 UNIT/ML ~~LOC~~ SOLN
0.0000 [IU] | SUBCUTANEOUS | Status: DC
  Administered 2012-10-10 – 2012-10-11 (×2): 2 [IU] via SUBCUTANEOUS

## 2012-10-10 MED ORDER — FUROSEMIDE 10 MG/ML IJ SOLN
20.0000 mg | Freq: Once | INTRAMUSCULAR | Status: AC
  Administered 2012-10-10: 20 mg via INTRAVENOUS
  Filled 2012-10-10: qty 2

## 2012-10-10 MED FILL — Magnesium Sulfate Inj 50%: INTRAMUSCULAR | Qty: 10 | Status: AC

## 2012-10-10 MED FILL — Potassium Chloride Inj 2 mEq/ML: INTRAVENOUS | Qty: 40 | Status: AC

## 2012-10-10 NOTE — Progress Notes (Addendum)
Patient ID: Abigail Solis, female   DOB: [birth year], 72 y.o.   MRN: [id number] TCTS DAILY PROGRESS NOTE                   [address]            [place] [address]          [phone]      1 Day Post-Op Procedure(s) (LRB): CORONARY ARTERY BYPASS GRAFTING (CABG) (N/A) INTRAOPERATIVE TRANSESOPHAGEAL ECHOCARDIOGRAM (N/A)  Total Length of Stay:  LOS: 7 days   Subjective: Awake and neuro intact  Objective: Vital signs in last 24 hours: Temp:  [94.1 F (34.5 C)-98.4 F (36.9 C)] 98.4 F (36.9 C) (01/15 0600) Pulse Rate:  [68-90] 80  (01/15 0600) Cardiac Rhythm:  [-] Atrial paced (01/15 0400) Resp:  [10-24] 19  (01/15 0600) BP: (90-142)/(58-81) 101/71 mmHg (01/15 0600) SpO2:  [91 %-100 %] 97 % (01/15 0600) Arterial Line BP: (91-218)/(44-117) 113/59 mmHg (01/15 0600) FiO2 (%):  [40 %-50 %] 40 % (01/14 2128) Weight:  [145 lb 11.6 oz (66.1 kg)] 145 lb 11.6 oz (66.1 kg) (01/15 0500)  Filed Weights   10/04/12 0252 10/05/12 0622 10/10/12 0500  Weight: 130 lb 8 oz (59.194 kg) 130 lb 1.1 oz (59 kg) 145 lb 11.6 oz (66.1 kg)    Weight change:    Hemodynamic parameters for last 24 hours: PAP: (11-36)/(1-16) 36/15 mmHg CO:  [2.5 L/min-5.5 L/min] 5.5 L/min CI:  [1.6 L/min/m2-3.5 L/min/m2] 3.5 L/min/m2  Intake/Output from previous day: 01/14 0701 - 01/15 0700 In: 6329 [I.V.:3749; Blood:430; NG/GT:30; IV Piggyback:2120] Out: 7365 [Urine:5240; Blood:1625; Chest Tube:500]  Intake/Output this shift:    Current Meds: Scheduled Meds:   . acetaminophen  1,000 mg Oral Q6H   Or  . acetaminophen (TYLENOL) oral liquid 160 mg/5 mL  975 mg Per Tube Q6H  . aspirin EC  81 mg Oral Daily  . atorvastatin  80 mg Oral q1800  . bisacodyl  10 mg Oral Daily   Or  . bisacodyl  10 mg Rectal Daily  . cefUROXime (ZINACEF)  IV  1.5 g Intravenous Q12H  . docusate sodium  200 mg Oral Daily  . famotidine (PEPCID) IV  20 mg Intravenous Q12H  . insulin aspart  0-24 Units  Subcutaneous Q4H  . insulin aspart  0-24 Units Subcutaneous Q4H  . metoprolol tartrate  12.5 mg Oral BID   Or  . metoprolol tartrate  12.5 mg Per Tube BID  . pantoprazole  40 mg Oral Daily  . potassium chloride  10 mEq Intravenous Q1 Hr x 3  . sodium chloride  3 mL Intravenous Q12H   Continuous Infusions:   . sodium chloride 20 mL/hr at 10/09/12 2000  . sodium chloride 20 mL/hr (10/09/12 1619)  . dexmedetomidine Stopped (10/09/12 1730)  . DOPamine Stopped (10/09/12 1438)  . lactated ringers 20 mL/hr (10/09/12 1653)  . nitroGLYCERIN Stopped (10/10/12 0000)  . phenylephrine (NEO-SYNEPHRINE) Adult infusion Stopped (10/10/12 0300)   PRN Meds:.sodium chloride, metoprolol, midazolam, morphine injection, ondansetron (ZOFRAN) IV, oxyCODONE, sodium chloride  General appearance: alert, cooperative and mild distress Neurologic: intact Heart: regular rate and rhythm, S1, S2 normal, no murmur, click, rub or gallop and normal apical impulse Lungs: clear to auscultation bilaterally and normal percussion bilaterally Abdomen: soft, non-tender; bowel sounds normal; no masses,  no organomegaly Extremities: extremities normal, atraumatic, no cyanosis or edema, Homans sign is negative, no sign of DVT and  Wound: sternum stable  Lab  Results: CBC: Basename 10/10/12 0358 10/09/12 1922 10/09/12 1915  WBC 7.7 -- 8.0  HGB 8.9* 9.9* --  HCT 25.7* 29.0* --  PLT 77* -- 86*   BMET:  Basename 10/10/12 0358 10/09/12 1922 10/09/12 0448  NA 137 141 --  K 3.4* 4.4 --  CL 106 108 --  CO2 21 -- 22  GLUCOSE 89 121* --  BUN 9 9 --  CREATININE 0.59 0.60 --  CALCIUM 7.7* -- 8.8    PT/INR:  Basename 10/09/12 1340  LABPROT 17.1*  INR 1.43   Radiology: Dg Chest Portable 1 View  10/09/2012  *RADIOLOGY REPORT*  Clinical Data: Coronary bypass  PORTABLE CHEST - 1 VIEW  Comparison: 10/03/2012  Findings: Right mainstem intubation noted, this can be retracted 4 cm.  Swan-Ganz catheter within the central pulmonary  outflow tract. NG tube coiled in the stomach fundus.  Left chest tube in place. Postop changes from coronary bypass.  Normal heart size.  Central vascular congestion evident with mild interstitial prominence. Minimal left base atelectasis.  No effusion or pneumothorax.  IMPRESSION: Right mainstem intubation, recommend retraction 4 cm.  Mild vascular congestion and left base atelectasis  No effusion or pneumothorax  Findings called to 2300 ICU nurse, Neysa Bonito immediately following the exam   Original Report Authenticated By: Judie Petit. Miles Costain, M.D.      Assessment/Plan: S/P Procedure(s) (LRB): CORONARY ARTERY BYPASS GRAFTING (CABG) (N/A) INTRAOPERATIVE TRANSESOPHAGEAL ECHOCARDIOGRAM (N/A) Mobilize Diuresis d/c tubes/lines Continue foley due to strict I&O, patient in ICU and urinary output monitoring See progression orders Expected Acute  Blood - loss Anemia, given 2 prbcs while on pump for preop anemia Thrombocytopenia, will avoid heparin currently   Rumor Sun B 10/10/2012 8:06 AM

## 2012-10-10 NOTE — Op Note (Signed)
NAMESOKHNA, CHRISTOPH              ACCOUNT NO.:  [id number]  MEDICAL RECORD NO.:  [id number]  LOCATION:  2311                         FACILITY:  [facility]  PHYSICIAN:  Sheliah Plane, MD    DATE OF BIRTH:  1940/10/23  DATE OF PROCEDURE:  10/09/2012 DATE OF DISCHARGE:                              OPERATIVE REPORT   PREOPERATIVE DIAGNOSIS:  Coronary occlusive disease with recent subendocardial myocardial infarction.  POSTOPERATIVE DIAGNOSIS:  Coronary occlusive disease with recent subendocardial myocardial infarction.  SURGICAL PROCEDURE:  Coronary artery bypass grafting x4 with the left internal mammary to the left anterior descending coronary artery, reverse saphenous vein graft to the diagonal coronary artery, reverse saphenous vein graft to the distal circumflex, reverse saphenous vein graft to the distal right coronary artery with bilateral thigh and the vein harvesting.  SURGEON:  Dr. Sheliah Plane.  FIRST ASSISTANT:  Doree Fudge, PA.  BRIEF HISTORY:  The patient is a 72 year old female admitted with new onset of angina.  Troponins were elevated.  She was diagnosed with non- STEMI.  Cardiac catheterization was done by Dr. [name] which revealed a high-grade proximal to mid right coronary lesion of greater than 90%, complex LAD diagonal disease at 80% to 85%, and proximally 60% circumflex lesion.  Overall, ventricular function was preserved. Because of the patient's significant coronary artery disease, new anginal symptoms, and the complex nature of the LAD diagonal disease; coronary artery bypass graft was recommended.  The patient agreed and signed informed consent.  DESCRIPTION OF PROCEDURE:  With Swan-Ganz and arterial line monitors in place, the patient underwent general endotracheal anesthesia without incidence.  Skin of chest and legs were prepped with Betadine and draped in usual sterile manner.  Using the [employer] endo vein harvesting system, vein was  harvested initially from the right thigh.  This provided one segment, but below the knee the vein was very small.  Additional vein was harvested from the left thigh and was of adequate quality and caliber.  Median sternotomy was performed.  Left internal mammary artery was dissected down as pedicle graft.  Distal artery was divided, had good free flow.  Pericardium was opened.  Overall, ventricular function was preserved.  The patient was systemically heparinized.  The ascending aorta was cannulated.  The right atrium was cannulated and aortic root. Cardioplegia needle was introduced into the ascending aorta.  An aortic cross-clamp was applied and 500 mL of cold blood potassium cardioplegia was administered with diastolic arrest of the heart.  Myocardial septal temperatures were monitored throughout the crossclamp period.  Attention was turned first to the distal right coronary artery which was opened and a 1.5 mm probe.  Using a running 7-0 Prolene, distal anastomosis was performed with a second reverse saphenous vein graft.  Attention was then turned to the distal circumflex which was a larger vessel.  A 1.5- 1.8 mm in size.  Using a running 7-0 Prolene, distal anastomosis was performed.  Attention was then turned to the diagonal vessel which was 1.5 mm in size.  The vessel was opened.  Using a running 7-0 Prolene, distal anastomosis was performed with a second reverse saphenous vein graft.  The left internal mammary artery was then used  to bypass the LAD.  The LAD was partially intramyocardial vessel, was opened and admitted a 1.5 mm probe distally.  Using a running 8-0 Prolene, left internal mammary artery anastomosed to left anterior descending coronary artery.  With crossclamp still in place, 3 punch aortotomies were performed.  Each of the 3 vein grafts anastomosed to the ascending aorta.  Air was evacuated from the grafts and bulldog on the mammary artery was removed with prompt  rise in myocardial temperature.  Aortic crossclamp was then removed with total crossclamp time was 97 minutes. The patient required electric defibrillation, returned to a sinus rhythm.  Atrial and ventricular pacing wires were applied.  She initially required atrial pacing to increase rate.  With the body temperature rewarmed to 37 degrees, she was then ventilated and weaned from cardiopulmonary bypass without difficulty.  She was decannulated in usual fashion.  Protamine sulfate was administered with operative field hemostatic.  The left pleural tube and a Blake mediastinal drain were left in place.  Sternum was closed with #6 stainless steel wire.  Fascia closed with interrupted 0 Vicryl, running 3-0 Vicryl in subcutaneous tissue, 4-0 subcuticular stitch in skin edges.  Dry dressings were applied.  Sponge and needle count was reported as correct.  She did require 2 packs of red cells because of preoperative anemia and low hematocrit while on cardiopulmonary bypass.     Sheliah Plane, MD     EG/MEDQ  D:  10/10/2012  T:  10/10/2012  Job:  [id number]

## 2012-10-10 NOTE — Progress Notes (Signed)
Patient ID: Abigail Solis, female   DOB: 17-Dec-1940, 72 y.o.   MRN: [id number]  Hemodynamically stable  Urine output good  CBC    Component Value Date/Time   WBC 9.8 10/10/2012 1700   RBC 3.50* 10/10/2012 1700   HGB 10.2* 10/10/2012 1700   HCT 30.2* 10/10/2012 1700   PLT 89* 10/10/2012 1700   MCV 86.3 10/10/2012 1700   MCH 29.1 10/10/2012 1700   MCHC 33.8 10/10/2012 1700   RDW 15.5 10/10/2012 1700   LYMPHSABS 1.7 10/04/2012 0244   MONOABS 0.4 10/04/2012 0244   EOSABS 0.0 10/04/2012 0244   BASOSABS 0.0 10/04/2012 0244    BMET    Component Value Date/Time   NA 138 10/10/2012 1659   K 4.0 10/10/2012 1659   CL 104 10/10/2012 1659   CO2 21 10/10/2012 0358   GLUCOSE 125* 10/10/2012 1659   BUN 10 10/10/2012 1659   CREATININE 0.64 10/10/2012 1700   CALCIUM 7.7* 10/10/2012 0358   GFRNONAA 88* 10/10/2012 1700   GFRAA >90 10/10/2012 1700    Up in chair. Stable day.

## 2012-10-11 ENCOUNTER — Inpatient Hospital Stay (HOSPITAL_COMMUNITY): Payer: Medicare Other

## 2012-10-11 ENCOUNTER — Encounter (HOSPITAL_COMMUNITY): Payer: Self-pay | Admitting: Cardiothoracic Surgery

## 2012-10-11 LAB — BASIC METABOLIC PANEL
BUN: 11 mg/dL (ref 6–23)
CO2: 23 mEq/L (ref 19–32)
Calcium: 8.2 mg/dL — ABNORMAL LOW (ref 8.4–10.5)
Chloride: 102 mEq/L (ref 96–112)
Creatinine, Ser: 0.51 mg/dL (ref 0.50–1.10)
GFR calc Af Amer: >90 mL/min (ref >90)
GFR calc non Af Amer: >90 mL/min (ref >90)
Glucose, Bld: 142 mg/dL — ABNORMAL HIGH (ref 70–99)
Potassium: 3.6 mEq/L (ref 3.5–5.1)
Sodium: 135 mEq/L (ref 135–145)

## 2012-10-11 LAB — GLUCOSE, CAPILLARY
Glucose-Capillary: 110 mg/dL — ABNORMAL HIGH (ref 70–99)
Glucose-Capillary: 110 mg/dL — ABNORMAL HIGH (ref 70–99)
Glucose-Capillary: 116 mg/dL — ABNORMAL HIGH (ref 70–99)
Glucose-Capillary: 122 mg/dL — ABNORMAL HIGH (ref 70–99)
Glucose-Capillary: 137 mg/dL — ABNORMAL HIGH (ref 70–99)

## 2012-10-11 LAB — CBC
HCT: 28.8 % — ABNORMAL LOW (ref 36.0–46.0)
Hemoglobin: 9.8 g/dL — ABNORMAL LOW (ref 12.0–15.0)
MCH: 29.4 pg (ref 26.0–34.0)
MCHC: 34 g/dL (ref 30.0–36.0)
MCV: 86.5 fL (ref 78.0–100.0)
Platelets: 94 10*3/uL — ABNORMAL LOW (ref 150–400)
RBC: 3.33 MIL/uL — ABNORMAL LOW (ref 3.87–5.11)
RDW: 15.3 % (ref 11.5–15.5)
WBC: 11.1 10*3/uL — ABNORMAL HIGH (ref 4.0–10.5)

## 2012-10-11 MED ORDER — SODIUM CHLORIDE 0.9 % IJ SOLN
3.0000 mL | Freq: Two times a day (BID) | INTRAMUSCULAR | Status: DC
  Administered 2012-10-11 – 2012-10-13 (×4): 3 mL via INTRAVENOUS

## 2012-10-11 MED ORDER — SODIUM CHLORIDE 0.9 % IV SOLN: 250.0000 mL | INTRAVENOUS | Status: DC | PRN

## 2012-10-11 MED ORDER — DOCUSATE SODIUM 100 MG PO CAPS
200.0000 mg | ORAL_CAPSULE | Freq: Every day | ORAL | Status: DC
  Administered 2012-10-12 – 2012-10-14 (×3): 200 mg via ORAL
  Filled 2012-10-11 (×3): qty 2

## 2012-10-11 MED ORDER — BISACODYL 5 MG PO TBEC
10.0000 mg | DELAYED_RELEASE_TABLET | Freq: Every day | ORAL | Status: DC | PRN
  Administered 2012-10-11: 10 mg via ORAL
  Filled 2012-10-11: qty 2

## 2012-10-11 MED ORDER — DEXTROSE 5 % IV SOLN
1.5000 g | Freq: Once | INTRAVENOUS | Status: AC
  Administered 2012-10-11: 1.5 g via INTRAVENOUS
  Filled 2012-10-11: qty 1.5

## 2012-10-11 MED ORDER — FUROSEMIDE 20 MG PO TABS
20.0000 mg | ORAL_TABLET | Freq: Every day | ORAL | Status: DC
  Administered 2012-10-11 – 2012-10-14 (×4): 20 mg via ORAL
  Filled 2012-10-11 (×4): qty 1

## 2012-10-11 MED ORDER — INSULIN ASPART 100 UNIT/ML ~~LOC~~ SOLN
0.0000 [IU] | Freq: Three times a day (TID) | SUBCUTANEOUS | Status: DC
  Administered 2012-10-11: 2 [IU] via SUBCUTANEOUS

## 2012-10-11 MED ORDER — METOPROLOL TARTRATE 12.5 MG HALF TABLET
12.5000 mg | ORAL_TABLET | Freq: Two times a day (BID) | ORAL | Status: DC
  Administered 2012-10-11 – 2012-10-14 (×6): 12.5 mg via ORAL
  Filled 2012-10-11 (×9): qty 1

## 2012-10-11 MED ORDER — ASPIRIN EC 325 MG PO TBEC
325.0000 mg | DELAYED_RELEASE_TABLET | Freq: Every day | ORAL | Status: DC
  Filled 2012-10-11: qty 1

## 2012-10-11 MED ORDER — POTASSIUM CHLORIDE 10 MEQ/50ML IV SOLN
10.0000 meq | INTRAVENOUS | Status: DC
  Administered 2012-10-11 (×3): 10 meq via INTRAVENOUS
  Filled 2012-10-11: qty 150

## 2012-10-11 MED ORDER — PANTOPRAZOLE SODIUM 40 MG PO TBEC
40.0000 mg | DELAYED_RELEASE_TABLET | Freq: Every day | ORAL | Status: DC
  Administered 2012-10-12 – 2012-10-14 (×3): 40 mg via ORAL
  Filled 2012-10-11 (×3): qty 1

## 2012-10-11 MED ORDER — ATORVASTATIN CALCIUM 20 MG PO TABS
20.0000 mg | ORAL_TABLET | Freq: Every day | ORAL | Status: DC
  Administered 2012-10-11: 20 mg via ORAL
  Filled 2012-10-11 (×2): qty 1

## 2012-10-11 MED ORDER — TRAMADOL HCL 50 MG PO TABS
50.0000 mg | ORAL_TABLET | ORAL | Status: DC | PRN
  Administered 2012-10-12: 50 mg via ORAL
  Filled 2012-10-11: qty 1

## 2012-10-11 MED ORDER — ASPIRIN 81 MG PO CHEW
324.0000 mg | CHEWABLE_TABLET | Freq: Every day | ORAL | Status: DC
  Administered 2012-10-11 – 2012-10-14 (×4): 324 mg via ORAL
  Filled 2012-10-11 (×4): qty 4

## 2012-10-11 MED ORDER — BISACODYL 10 MG RE SUPP
10.0000 mg | Freq: Every day | RECTAL | Status: DC | PRN
  Administered 2012-10-14: 10 mg via RECTAL
  Filled 2012-10-11: qty 1

## 2012-10-11 MED ORDER — SODIUM CHLORIDE 0.9 % IJ SOLN
3.0000 mL | INTRAMUSCULAR | Status: DC | PRN
  Administered 2012-10-11: 3 mL via INTRAVENOUS

## 2012-10-11 MED ORDER — MOVING RIGHT ALONG BOOK
Freq: Once | Status: AC
  Administered 2012-10-11: 10:00:00
  Filled 2012-10-11: qty 1

## 2012-10-11 MED ORDER — OXYCODONE HCL 5 MG PO TABS: 5.0000 mg | ORAL_TABLET | ORAL | Status: DC | PRN

## 2012-10-11 MED ORDER — POTASSIUM CHLORIDE CRYS ER 10 MEQ PO TBCR
10.0000 meq | EXTENDED_RELEASE_TABLET | Freq: Every day | ORAL | Status: DC
  Administered 2012-10-11 – 2012-10-14 (×4): 10 meq via ORAL
  Filled 2012-10-11 (×4): qty 1

## 2012-10-11 MED FILL — Heparin Sodium (Porcine) Inj 1000 Unit/ML: INTRAMUSCULAR | Qty: 10 | Status: AC

## 2012-10-11 MED FILL — Mannitol IV Soln 20%: INTRAVENOUS | Qty: 500 | Status: AC

## 2012-10-11 MED FILL — Sodium Bicarbonate IV Soln 8.4%: INTRAVENOUS | Qty: 50 | Status: AC

## 2012-10-11 MED FILL — Electrolyte-R (PH 7.4) Solution: INTRAVENOUS | Qty: 4000 | Status: AC

## 2012-10-11 MED FILL — Sodium Chloride IV Soln 0.9%: INTRAVENOUS | Qty: 1000 | Status: AC

## 2012-10-11 MED FILL — Sodium Chloride Irrigation Soln 0.9%: Qty: 3000 | Status: AC

## 2012-10-11 MED FILL — Heparin Sodium (Porcine) Inj 1000 Unit/ML: INTRAMUSCULAR | Qty: 30 | Status: AC

## 2012-10-11 MED FILL — Lidocaine HCl IV Inj 20 MG/ML: INTRAVENOUS | Qty: 5 | Status: AC

## 2012-10-11 NOTE — Progress Notes (Signed)
Patient ID: Abigail Solis, female   DOB: November 22, 1940, 72 y.o.   MRN: [id number] POD # 2  BP 106/62  Pulse 78  Temp 98.5 F (36.9 C) (Oral)  Resp 17  Ht 5' (1.524 m)  Wt 142 lb 6.7 oz (64.6 kg)  BMI 27.81 kg/m2  SpO2 97%   Intake/Output Summary (Last 24 hours) at 10/11/12 1743 Last data filed at 10/11/12 1600  Gross per 24 hour  Intake  537.1 ml  Output   2310 ml  Net -1772.9 ml    Awaiting bed on 2000

## 2012-10-11 NOTE — Progress Notes (Signed)
Patient ID: Abigail Solis, female   DOB: [birth year], 72 y.o.   MRN: [id number] [facility] DAILY PROGRESS NOTE                   [address]            [place] Solis [address]          [phone]      2 Days Post-Op Procedure(s) (LRB): CORONARY ARTERY BYPASS GRAFTING (CABG) (N/A) INTRAOPERATIVE TRANSESOPHAGEAL ECHOCARDIOGRAM (N/A)  Total Length of Stay:  LOS: 8 days   Subjective: Awake and alert feels better this am. Some nausea with morphine  Objective: Vital signs in last 24 hours: Temp:  [98.4 F (36.9 C)-100 F (37.8 C)] 100 F (37.8 C) (01/16 0440) Pulse Rate:  [68-80] 75  (01/16 0700) Cardiac Rhythm:  [-] Normal sinus rhythm (01/16 0400) Resp:  [13-23] 19  (01/16 0700) BP: (90-138)/(50-65) 110/57 mmHg (01/16 0700) SpO2:  [91 %-98 %] 96 % (01/16 0700) Arterial Line BP: (97-120)/(46-57) 112/53 mmHg (01/15 1200) Weight:  [142 lb 6.7 oz (64.6 kg)] 142 lb 6.7 oz (64.6 kg) (01/16 0600)  Filed Weights   10/10/12 0500 10/11/12 0500 10/11/12 0600  Weight: 145 lb 11.6 oz (66.1 kg) 142 lb 6.7 oz (64.6 kg) 142 lb 6.7 oz (64.6 kg)    Weight change: -3 lb 4.9 oz (-1.5 kg)   Hemodynamic parameters for last 24 hours: PAP: (27-28)/(7-8) 28/8 mmHg  Intake/Output from previous day: 01/15 0701 - 01/16 0700 In: 777.1 [P.O.:150; I.V.:477.1; IV Piggyback:150] Out: 2420 [Urine:2100; Chest Tube:320]  Intake/Output this shift:    Current Meds: Scheduled Meds:   . acetaminophen  1,000 mg Oral Q6H   Or  . acetaminophen (TYLENOL) oral liquid 160 mg/5 mL  975 mg Per Tube Q6H  . aspirin EC  81 mg Oral Daily  . atorvastatin  80 mg Oral q1800  . bisacodyl  10 mg Oral Daily   Or  . bisacodyl  10 mg Rectal Daily  . cefUROXime (ZINACEF)  IV  1.5 g Intravenous Q12H  . docusate sodium  200 mg Oral Daily  . insulin aspart  0-24 Units Subcutaneous Q4H  . metoprolol tartrate  12.5 mg Oral BID   Or  . metoprolol tartrate  12.5 mg Per Tube BID  . pantoprazole  40 mg Oral Daily    . potassium chloride  10 mEq Intravenous Q1 Hr x 3  . sodium chloride  3 mL Intravenous Q12H   Continuous Infusions:   . sodium chloride 20 mL/hr at 10/11/12 0200  . sodium chloride 20 mL/hr (10/09/12 1619)  . dexmedetomidine Stopped (10/09/12 1730)  . DOPamine 3 mcg/kg/min (10/11/12 0049)  . lactated ringers 20 mL/hr (10/09/12 1653)  . nitroGLYCERIN Stopped (10/10/12 0000)  . phenylephrine (NEO-SYNEPHRINE) Adult infusion Stopped (10/10/12 0300)   PRN Meds:.sodium chloride, metoprolol, midazolam, morphine injection, ondansetron (ZOFRAN) IV, oxyCODONE, sodium chloride  General appearance: alert and cooperative Neurologic: intact Heart: regular rate and rhythm, S1, S2 normal, no murmur, click, rub or gallop and normal apical impulse Lungs: clear to auscultation bilaterally and normal percussion bilaterally Abdomen: soft, non-tender; bowel sounds normal; no masses,  no organomegaly Extremities: extremities normal, atraumatic, no cyanosis or edema and Homans sign is negative, no sign of DVT Wound: sternum stable  Lab Results: CBC: Basename 10/11/12 0359 10/10/12 1700  WBC 11.1* 9.8  HGB 9.8* 10.2*  HCT 28.8* 30.2*  PLT 94* 89*   BMET:  Basename 10/11/12 0359 10/10/12 1700 10/10/12  1659 10/10/12 0358  NA 135 -- 138 --  K 3.6 -- 4.0 --  CL 102 -- 104 --  CO2 23 -- -- 21  GLUCOSE 142* -- 125* --  BUN 11 -- 10 --  CREATININE 0.51 0.64 -- --  CALCIUM 8.2* -- -- 7.7*    PT/INR:  Basename 10/09/12 1340  LABPROT 17.1*  INR 1.43   Radiology: Dg Chest Portable 1 View In Am  10/10/2012  *RADIOLOGY REPORT*  Clinical Data: Status post CABG  PORTABLE CHEST - 1 VIEW  Comparison: 10/09/2012  Findings: Left chest tube remains.  Potentially a tiny amount of left pleural air is present superiorly.  There is evidence of a roughly 5% right apical pneumothorax which was not evident on the prior chest x-ray.  Minimal bibasilar atelectasis present.  No pulmonary edema identified.  Swan-Ganz  catheter tip is located in the proximal right pulmonary artery.  No significant pleural effusions.  IMPRESSION: Newly visualized 5% right apical pneumothorax by chest x-ray. Small amount of pleural air is suspected over the left apex.   Original Report Authenticated By: Irish Lack, M.D.    Dg Chest Portable 1 View  10/09/2012  *RADIOLOGY REPORT*  Clinical Data: Coronary bypass  PORTABLE CHEST - 1 VIEW  Comparison: 10/03/2012  Findings: Right mainstem intubation noted, this can be retracted 4 cm.  Swan-Ganz catheter within the central pulmonary outflow tract. NG tube coiled in the stomach fundus.  Left chest tube in place. Postop changes from coronary bypass.  Normal heart size.  Central vascular congestion evident with mild interstitial prominence. Minimal left base atelectasis.  No effusion or pneumothorax.  IMPRESSION: Right mainstem intubation, recommend retraction 4 cm.  Mild vascular congestion and left base atelectasis  No effusion or pneumothorax  Findings called to 2300 ICU nurse, Abigail Solis immediately following the exam   Original Report Authenticated By: Judie Petit. Miles Costain, M.D.      Assessment/Plan: S/P Procedure(s) (LRB): CORONARY ARTERY BYPASS GRAFTING (CABG) (N/A) INTRAOPERATIVE TRANSESOPHAGEAL ECHOCARDIOGRAM (N/A) Mobilize Diuresis d/c tubes/lines Plan for transfer to step-down: see transfer orders Thrombocytopenia improving , still avoid heparin     Zamarah Ullmer B 10/11/2012 8:10 AM

## 2012-10-11 NOTE — Progress Notes (Signed)
Pt transferred to 2016 per w/c with belongings. Family with pt. Nurse at bedside. Pt tol transfer well.

## 2012-10-12 ENCOUNTER — Inpatient Hospital Stay (HOSPITAL_COMMUNITY): Payer: Medicare Other

## 2012-10-12 DIAGNOSIS — I2510 Atherosclerotic heart disease of native coronary artery without angina pectoris: Secondary | ICD-10-CM

## 2012-10-12 LAB — CBC
HCT: 29.8 % — ABNORMAL LOW (ref 36.0–46.0)
Hemoglobin: 9.7 g/dL — ABNORMAL LOW (ref 12.0–15.0)
MCH: 28.5 pg (ref 26.0–34.0)
MCHC: 32.6 g/dL (ref 30.0–36.0)
MCV: 87.6 fL (ref 78.0–100.0)
Platelets: 143 10*3/uL — ABNORMAL LOW (ref 150–400)
RBC: 3.4 MIL/uL — ABNORMAL LOW (ref 3.87–5.11)
RDW: 15.2 % (ref 11.5–15.5)
WBC: 8.5 10*3/uL (ref 4.0–10.5)

## 2012-10-12 LAB — BASIC METABOLIC PANEL
BUN: 15 mg/dL (ref 6–23)
CO2: 23 mEq/L (ref 19–32)
Calcium: 8.4 mg/dL (ref 8.4–10.5)
Chloride: 103 mEq/L (ref 96–112)
Creatinine, Ser: 0.65 mg/dL (ref 0.50–1.10)
GFR calc Af Amer: >90 mL/min (ref >90)
GFR calc non Af Amer: 87 mL/min — ABNORMAL LOW (ref >90)
Glucose, Bld: 120 mg/dL — ABNORMAL HIGH (ref 70–99)
Potassium: 3.5 mEq/L (ref 3.5–5.1)
Sodium: 137 mEq/L (ref 135–145)

## 2012-10-12 LAB — TYPE AND SCREEN
ABO/RH(D): A POS
Antibody Screen: NEGATIVE
Unit division: 0
Unit division: 0
Unit division: 0
Unit division: 0

## 2012-10-12 LAB — GLUCOSE, CAPILLARY
Glucose-Capillary: 105 mg/dL — ABNORMAL HIGH (ref 70–99)
Glucose-Capillary: 105 mg/dL — ABNORMAL HIGH (ref 70–99)

## 2012-10-12 MED ORDER — ASPIRIN 81 MG PO CHEW: 324.0000 mg | CHEWABLE_TABLET | Freq: Every day | ORAL | Status: DC

## 2012-10-12 MED ORDER — ATORVASTATIN CALCIUM 40 MG PO TABS: 40.0000 mg | ORAL_TABLET | Freq: Every day | ORAL | Status: DC

## 2012-10-12 MED ORDER — METOPROLOL TARTRATE 25 MG PO TABS: 12.5000 mg | ORAL_TABLET | Freq: Two times a day (BID) | ORAL | Status: DC

## 2012-10-12 MED ORDER — POLYETHYLENE GLYCOL 3350 17 G PO PACK
17.0000 g | PACK | Freq: Once | ORAL | Status: AC
  Administered 2012-10-12: 17 g via ORAL
  Filled 2012-10-12: qty 1

## 2012-10-12 MED ORDER — FUROSEMIDE 20 MG PO TABS: 20.0000 mg | ORAL_TABLET | Freq: Every day | ORAL | Status: DC

## 2012-10-12 MED ORDER — ATORVASTATIN CALCIUM 40 MG PO TABS
40.0000 mg | ORAL_TABLET | Freq: Every day | ORAL | Status: DC
  Administered 2012-10-12 – 2012-10-13 (×2): 40 mg via ORAL
  Filled 2012-10-12 (×3): qty 1

## 2012-10-12 MED ORDER — TRAMADOL HCL 50 MG PO TABS: 50.0000 mg | ORAL_TABLET | ORAL | Status: DC | PRN

## 2012-10-12 MED ORDER — POTASSIUM CHLORIDE CRYS ER 10 MEQ PO TBCR: 10.0000 meq | EXTENDED_RELEASE_TABLET | Freq: Every day | ORAL | Status: DC

## 2012-10-12 MED ORDER — POTASSIUM CHLORIDE CRYS ER 20 MEQ PO TBCR
40.0000 meq | EXTENDED_RELEASE_TABLET | Freq: Once | ORAL | Status: AC
  Administered 2012-10-12: 40 meq via ORAL
  Filled 2012-10-12 (×2): qty 2

## 2012-10-12 NOTE — Progress Notes (Addendum)
                   [address]            [place] [address]          [phone]      3 Days Post-Op Procedure(s) (LRB): CORONARY ARTERY BYPASS GRAFTING (CABG) (N/A) INTRAOPERATIVE TRANSESOPHAGEAL ECHOCARDIOGRAM (N/A)  Subjective: Patient "sore" but otherwise no complaints. She is passing flatus but no bowel movement yet.  Objective: Vital signs in last 24 hours: Temp:  [98.2 F (36.8 C)-99.3 F (37.4 C)] 99.3 F (37.4 C) (01/17 0418) Pulse Rate:  [75-131] 86  (01/17 0418) Cardiac Rhythm:  [-] Normal sinus rhythm (01/16 1935) Resp:  [16-26] 16  (01/17 0418) BP: (94-167)/(54-82) 118/67 mmHg (01/17 0418) SpO2:  [92 %-99 %] 95 % (01/17 0418) Weight:  [62.2 kg (137 lb 2 oz)] 62.2 kg (137 lb 2 oz) (01/17 0418)  Pre op weight  59 kg Current Weight  10/12/12 62.2 kg (137 lb 2 oz)      Intake/Output from previous day: 01/16 0701 - 01/17 0700 In: 370 [P.O.:350; I.V.:20] Out: 2180 [Urine:2180]   Physical Exam:  Cardiovascular: RRR, no murmurs, gallops, or rubs. Pulmonary: Clear to auscultation bilaterally; no rales, wheezes, or rhonchi. Abdomen: Soft, non tender, bowel sounds present. Extremities: Mild bilateral lower extremity edema. Wounds: Clean and dry.  No erythema or signs of infection.Ecchymosis bilateral thighs  Lab Results: CBC: Basename 10/12/12 0500 10/11/12 0359  WBC 8.5 11.1*  HGB 9.7* 9.8*  HCT 29.8* 28.8*  PLT 143* 94*   BMET:  Basename 10/12/12 0500 10/11/12 0359  NA 137 135  K 3.5 3.6  CL 103 102  CO2 23 23  GLUCOSE 120* 142*  BUN 15 11  CREATININE 0.65 0.51  CALCIUM 8.4 8.2*    PT/INR:  Lab Results  Component Value Date   INR 1.43 10/09/2012   INR 0.99 10/04/2012   ABG:  INR: Will add last result for INR, ABG once components are confirmed Will add last 4 CBG results once components are confirmed  Assessment/Plan:  1. CV - SR. On Lopressor 12.5 bid 2.  Pulmonary - CXR this am appears to shows decreased small right  apical pneumothorax and probable trace left apical pneumothorax, and right base atelectasis.Encourage incentive spirometer 3. Volume Overload - Continue with diuresis 4.  Acute blood loss anemia - H and stable at 9.7 and 29.8 5.Supplement potassium 6.HGA1C 6. CBGs 97/110/105. Likely pre diabetic and will need further surveillance as an outpatient. Stop glucose checks 7.Remove EPW in am 8.Miralax for constipation 9.Possible discharge Sunday or Monday  ZIMMERMAN,DONIELLE MPA-C 10/12/2012,7:32 AM   I have seen and examined Renne Crigler and agree with the above assessment  and plan.  Delight Ovens MD Beeper [phone] Office [phone] 10/12/2012 3:58 PM

## 2012-10-12 NOTE — Progress Notes (Signed)
Pt not wanting to ambulate at this time; pt requesting to nap; encouraged pt to ambulate later this afternoon; will cont. To monitor; husband at bedside.

## 2012-10-12 NOTE — Progress Notes (Signed)
CARDIAC REHAB PHASE I   PRE:  Rate/Rhythm:   BP:  Supine:   Sitting:   Standing:    SaO2:   Pt up in hall with husband  MODE:  Ambulation: 600 ft   POST:  Rate/Rhythem: 96SR  BP:  Supine:   Sitting: 120/70  Standing:    SaO2: 94%RA 1005-1055 Pt up in hall walking with husband. Walked with them and encouraged increasing distance. Pt walked 600 ft on RA with asst x 1. Gait steady. Tolerated well. Education completed with pt and husband. Permission given to refer to [place] Phase 2. Encouraged pt and husband to view discharge video. Pt to recliner after walk.  Duanne Limerick

## 2012-10-12 NOTE — Progress Notes (Signed)
SUBJECTIVE:  She feels relatively well and ambulated today.     PHYSICAL EXAM Filed Vitals:   10/11/12 1800 10/11/12 1857 10/11/12 2113 10/12/12 0418  BP: 116/59 121/54 118/61 118/67  Pulse: 92 91 90 86  Temp:  99 F (37.2 C) 98.2 F (36.8 C) 99.3 F (37.4 C)  TempSrc:   Oral Oral  Resp: 26 18 18 16   Height:      Weight:    137 lb 2 oz (62.2 kg)  SpO2: 99% 97% 92% 95%   General:  No distress Lungs:  Few basilar crackles Heart:  RRR Abdomen:  Positive bowel sounds, no rebound no guarding Extremities:  Mild bilateral edema  LABS:  Results for orders placed during the hospital encounter of 10/03/12 (from the past 24 hour(s))  GLUCOSE, CAPILLARY     Status: Abnormal   Collection Time   10/11/12 11:47 AM      Component Value Range   Glucose-Capillary 137 (*) 70 - 99 mg/dL  GLUCOSE, CAPILLARY     Status: Normal   Collection Time   10/11/12  6:14 PM      Component Value Range   Glucose-Capillary 97  70 - 99 mg/dL  GLUCOSE, CAPILLARY     Status: Abnormal   Collection Time   10/11/12  8:56 PM      Component Value Range   Glucose-Capillary 110 (*) 70 - 99 mg/dL  CBC     Status: Abnormal   Collection Time   10/12/12  5:00 AM      Component Value Range   WBC 8.5  4.0 - 10.5 K/uL   RBC 3.40 (*) 3.87 - 5.11 MIL/uL   Hemoglobin 9.7 (*) 12.0 - 15.0 g/dL   HCT 21.3 (*) 08.6 - 57.8 %   MCV 87.6  78.0 - 100.0 fL   MCH 28.5  26.0 - 34.0 pg   MCHC 32.6  30.0 - 36.0 g/dL   RDW 46.9  62.9 - 52.8 %   Platelets 143 (*) 150 - 400 K/uL  BASIC METABOLIC PANEL     Status: Abnormal   Collection Time   10/12/12  5:00 AM      Component Value Range   Sodium 137  135 - 145 mEq/L   Potassium 3.5  3.5 - 5.1 mEq/L   Chloride 103  96 - 112 mEq/L   CO2 23  19 - 32 mEq/L   Glucose, Bld 120 (*) 70 - 99 mg/dL   BUN 15  6 - 23 mg/dL   Creatinine, Ser 4.13  0.50 - 1.10 mg/dL   Calcium 8.4  8.4 - 24.4 mg/dL   GFR calc non Af Amer 87 (*) >90 mL/min   GFR calc Af Amer >90  >90 mL/min  GLUCOSE,  CAPILLARY     Status: Abnormal   Collection Time   10/12/12  6:26 AM      Component Value Range   Glucose-Capillary 105 (*) 70 - 99 mg/dL    Intake/Output Summary (Last 24 hours) at 10/12/12 1059 Last data filed at 10/12/12 0730  Gross per 24 hour  Intake    540 ml  Output   2250 ml  Net  -1710 ml    ASSESSMENT AND PLAN:  CAD:  She will continue with rehab and secondary risk reduction.   Dyslipidemia:  I will increase the Lipitor to current high dose (Lipitor 40 mg) as recommended for patients less than 75 with known ASCAD.      Fayrene Fearing  Amyre Segundo 10/12/2012 10:59 AM

## 2012-10-12 NOTE — Discharge Summary (Signed)
Physician Discharge Summary  Patient ID: SAROYA RICCOBONO MRN: [id number] DOB/AGE: [birth year] 72 y.o.  Admit date: 10/03/2012 Discharge date: 10/14/2012  Admission Diagnoses: 1.NSTEMI (non-ST elevated myocardial infarction) 2.Multivessel CAD 3.History of hypertension 4.Hisotryof mixed hyperlipidemia 5.History of CHF 6.History of GERD  Discharge Diagnoses:   1.NSTEMI (non-ST elevated myocardial infarction) 2.Multivessel CAD 3.History of hypertension 4.Hisotryof mixed hyperlipidemia 5.History of CHF 6.History of GERD 7. Pre diabetes (HGA1C 6) 8.Mild thrombocytopenia 9.ABL anemia  Procedure (s):  1.Coronary angiography done on 10/04/2012 by Dr. [name]: Coronary dominance: right  Left mainstem: There is mild 20% narrowing at the ostium.  Left anterior descending (LAD): The LAD has an 80% stenosis immediately after the takeoff of the first diagonal followed by 70% stenosis in the mid vessel. The first diagonal is a large branch which has a 90% stenosis proximally. The segment of disease does extend to the ostium.  Left circumflex (LCx): The left circumflex is a large vessel. It gives rise to 3 marginal branches before terminating on the posterior lateral wall. There is 50% stenosis in the mid circumflex between the first and second marginal branches. There is 30% disease distal to the second OM.  Right coronary artery (RCA): The right coronary is a codominant vessel. It has a 90-95% stenosis in the proximal to mid vessel. This is a segmental stenosis.  Left ventriculography: Left ventricular systolic function is normal, LVEF is estimated at 6065%, there is a small focal area of inferior apical hypokinesis,there is no significant mitral regurgitation   2.Coronary artery bypass grafting x4 with the left  internal mammary to the left anterior descending coronary artery, reverse saphenous vein graft to the diagonal coronary artery, reverse saphenous vein graft to the distal circumflex, reverse  saphenous vein graft to the distal right coronary artery with bilateral thigh and the vein harvesting by Dr. Tyrone Sage on 10/09/2012.   History of Presenting Illness: This is a 72 yo with no known history of coronary artery disease, but whose risk factors include hypertension and hyperlipidemia. The patient developed a headache the Tuesday evening prior to admission that transitioned into some jaw and mild neck discomfort. The patient also noted some chest discomfort as well. She stated this started around 5 pm and last around 5 hours. Her husband took her her blood pressure, which was elevated. The patient did not sleep much that night. The patient's chest pain did not return. She was evaluated by Dr. Margo Aye at the office on Wednesday. He obtained cardiac enzymes which were slightly elevated. Therefore, she was referred to the Emergency Department for further workup. Upon arrival, EKG was obtained and showed diffuse T wave inversions. The patient denied chest pain at that time. Due to the elevated Cardiac enzymes and EKG changes, it was felt the patient should be admitted for evaluation for possible CAD. She was placed on Heparin and NTG. Her cardiac enzymes continued to rise. She was taken for cardiac catheterization on 10/04/2012 and found to have a preserved LVEF of 60-65% and severe 2 vessel obstructive coronary disease. It was felt the patient would benefit from coronary re-vascularization by coronary bypass surgery and TCTS was consulted. The patient was currently chest pain free and denied shortness of breath at the time of being seen by Dr. Tyrone Sage. Pre operative carotid duplex  US showed no significant bilateral stenosis.Potential risks, complications, and benefits of the surgery were discussed with the patient and she agreed to proceed.She underwent a CABG x 4 on 1/14.  Brief Hospital Course:  She was  extubated later the evening of surgery. Her Theone Murdoch, a line, chest tubes, and foley were all removed  early in her post operative course. She remained afebrile and hemodynamically stable. She was started on Lopressor. She was volume overloaded and diuresed accordingly. She did have ABL anemia. She was given a transfusion intra op, but did not require one post op. She also had thrombocytopenia. Her platelets went as low as 89,000. She did not receive any heparin. Her last platelet count was up to 143,000. She was felt surgically stable for transfer from the ICU to PCTU for further convalescence on 10/11/2012. She has been tolerating a diet. She is passing flatus but has not had a bowel movement yet. She will be given Miralax, as she usually takes a similar medication at home. She is ambulating well on room air.Her epicardial pacing wires will be removed in the morning. Provided she remains afebrile, hemodynamically stable, and pending morning round evaluation, she will be surgically stable for discharge on 10/14/2012.   Latest Vital Signs: Blood pressure 118/67, pulse 86, temperature 99.3 F (37.4 C), temperature source Oral, resp. rate 16, height 5' (1.524 m), weight 62.2 kg (137 lb 2 oz), SpO2 95.00%.  Physical Exam: Cardiovascular: RRR, no murmurs, gallops, or rubs.  Pulmonary: Clear to auscultation bilaterally; no rales, wheezes, or rhonchi.  Abdomen: Soft, non tender, bowel sounds present.  Extremities: Mild bilateral lower extremity edema.  Wounds: Clean and dry. No erythema or signs of infection.Ecchymosis bilateral thighs   Discharge Condition:Stable  Recent laboratory studies:  Lab Results  Component Value Date   WBC 8.5 10/12/2012   HGB 9.7* 10/12/2012   HCT 29.8* 10/12/2012   MCV 87.6 10/12/2012   PLT 143* 10/12/2012   Lab Results  Component Value Date   NA 137 10/12/2012   K 3.5 10/12/2012   CL 103 10/12/2012   CO2 23 10/12/2012   CREATININE 0.65 10/12/2012   GLUCOSE 120* 10/12/2012      Diagnostic Studies: Dg Chest 2 View  10/12/2012  *RADIOLOGY REPORT*  Clinical Data: Postop  chest radiograph, cough, CABG  CHEST - 2 VIEW  Comparison: 10/11/2012; 10/10/2012; 10/03/2012  Findings:  Grossly unchanged cardiac silhouette and mediastinal contours post median sternotomy and CABG.  Grossly unchanged small right-sided hydropneumothorax.  Interval removal of left-sided chest tube.  No definite left-sided pneumothorax. Epicardial electrical leads persist.  Grossly unchanged small bilateral effusions and bibasilar heterogeneous opacities, left greater than right.  No new focal airspace opacities.  No evidence of pulmonary edema.  Unchanged bones.  IMPRESSION: 1.  Unchanged small right-sided hydropneumothorax. 2.  Interval removal of left-sided chest tube.  No definite left- sided pneumothorax. 3.  Unchanged small bilateral effusions and bibasilar heterogeneous opacities, left greater than right, likely atelectasis   Original Report Authenticated By: Tacey Ruiz, MD     Discharge Orders    Future Appointments: Provider: Department: Dept Phone: Center:   11/08/2012 10:30 AM Delight Ovens, MD [facility] [phone] [facility]     Future Orders Please Complete By Expires   Amb Referral to Cardiac Rehabilitation         Discharge Medications:   Medication List     As of 10/12/2012 12:30 PM    TAKE these medications         aspirin 81 MG chewable tablet   Chew 4 tablets (324 mg total) by mouth daily.      atorvastatin 40 MG tablet   Commonly known as: LIPITOR  Take 1 tablet (40 mg total) by mouth daily at 6 PM.      furosemide 20 MG tablet   Commonly known as: LASIX   Take 1 tablet (20 mg total) by mouth daily. For 4 days then stop      metoprolol tartrate 25 MG tablet   Commonly known as: LOPRESSOR   Take 0.5 tablets (12.5 mg total) by mouth 2 (two) times daily.      omeprazole 20 MG capsule   Commonly known as: PRILOSEC   Take 20 mg by mouth daily.      potassium chloride 10 MEQ tablet   Commonly known as: K-DUR,KLOR-CON     Take 1 tablet (10 mEq total) by mouth daily. For 4 days then stop.      traMADol 50 MG tablet   Commonly known as: ULTRAM   Take 1-2 tablets (50-100 mg total) by mouth every 4 (four) hours as needed for pain.      The patient has been discharged on:   1.Beta Blocker:  Yes [  x ]                              No   [   ]                              If No, reason:  2.Ace Inhibitor/ARB: Yes [   ]                                     No  [    x]                                     If No, reason:Labile blood pressure. Hope to start as outpatient  3.Statin:   Yes [ x  ]                  No  [   ]                  If No, reason:  4.Ecasa:  Yes  [  x ]                  No   [   ]                  If No, reason:   Follow Up Appointments:     Follow-up Information    Follow up with Charlton Haws, MD. (Please call for a follow up appoinment for 2 weeks)    Contact information:   [address] [place] [address] [phone]       Follow up with Sheliah Plane B, MD. (PA/LAT CXR to be taken (at [facility] which is in the same building as Dr. Dennie Maizes office) on 11/08/2012 at 9:30 am;Appointment with Dr. Tyrone Sage is on 11/08/2012 at 10:30 am)    Contact information:   [address] [place] [address] [phone]       Follow up with [facility], MD. (Call for a follow up appointment regarding further surveillance of HGA1C 6)    Contact information:   [address] [place]  [place] [address] [phone]          Signed: ZIMMERMAN,DONIELLE MPA-C 10/12/2012, 12:30 PM

## 2012-10-13 LAB — GLUCOSE, CAPILLARY
Glucose-Capillary: 153 mg/dL — ABNORMAL HIGH (ref 70–99)
Glucose-Capillary: 118 mg/dL — ABNORMAL HIGH (ref 70–99)

## 2012-10-13 MED ORDER — POLYETHYLENE GLYCOL 3350 17 G PO PACK
17.0000 g | PACK | Freq: Every day | ORAL | Status: DC
  Administered 2012-10-13: 17 g via ORAL
  Filled 2012-10-13 (×2): qty 1

## 2012-10-13 NOTE — Progress Notes (Signed)
EPWs removed per MD order and protocol, Wire ends intact. Pt tolerated procedure well. Pt on bedrest X 1 hr. Husband at bedside. Will continue to monitor.

## 2012-10-13 NOTE — Progress Notes (Signed)
CARDIAC REHAB PHASE 1 1130  Patient has been ambulating in the hall with husband and doing great. Patient has no questions at this time. Will continue to follow as needed.

## 2012-10-13 NOTE — Progress Notes (Addendum)
[address]            [place] [address]          [phone]     4 Days Post-Op  Procedure(s) (LRB): CORONARY ARTERY BYPASS GRAFTING (CABG) (N/A) INTRAOPERATIVE TRANSESOPHAGEAL ECHOCARDIOGRAM (N/A) Subjective: Feels well  Objective  Telemetry sinus rhythm   Temp:  [98.4 F (36.9 C)-99.8 F (37.7 C)] 98.4 F (36.9 C) (01/18 0646) Pulse Rate:  [77-86] 77  (01/18 0646) Resp:  [17-18] 18  (01/18 0646) BP: (108-124)/(58-68) 124/65 mmHg (01/18 0646) SpO2:  [92 %-96 %] 94 % (01/18 0646) Weight:  [135 lb 5.8 oz (61.4 kg)] 135 lb 5.8 oz (61.4 kg) (01/18 0646)   Intake/Output Summary (Last 24 hours) at 10/13/12 1021 Last data filed at 10/13/12 0600  Gross per 24 hour  Intake    600 ml  Output    600 ml  Net      0 ml       General appearance: alert, cooperative and no distress Heart: regular rate and rhythm Lungs: clear to auscultation bilaterally Abdomen: benign Extremities: + edema Wound: incisions healing well  Lab Results:  Basename 10/12/12 0500 10/11/12 0359 10/10/12 1700  NA 137 135 --  K 3.5 3.6 --  CL 103 102 --  CO2 23 23 --  GLUCOSE 120* 142* --  BUN 15 11 --  CREATININE 0.65 0.51 --  CALCIUM 8.4 8.2* --  MG -- -- 2.3  PHOS -- -- --   No results found for this basename: AST:2,ALT:2,ALKPHOS:2,BILITOT:2,PROT:2,ALBUMIN:2 in the last 72 hours No results found for this basename: LIPASE:2,AMYLASE:2 in the last 72 hours  Basename 10/12/12 0500 10/11/12 0359  WBC 8.5 11.1*  NEUTROABS -- --  HGB 9.7* 9.8*  HCT 29.8* 28.8*  MCV 87.6 86.5  PLT 143* 94*   No results found for this basename: CKTOTAL:4,CKMB:4,TROPONINI:4 in the last 72 hours No components found with this basename: POCBNP:3 No results found for this basename: DDIMER in the last 72 hours No results found for this basename: HGBA1C in the last 72 hours No results found for this basename: CHOL,HDL,LDLCALC,TRIG,CHOLHDL in the last 72 hours No results found  for this basename: TSH,T4TOTAL,FREET3,T3FREE,THYROIDAB in the last 72 hours No results found for this basename: VITAMINB12,FOLATE,FERRITIN,TIBC,IRON,RETICCTPCT in the last 72 hours  Medications: Scheduled    . aspirin  324 mg Oral Daily  . atorvastatin  40 mg Oral q1800  . docusate sodium  200 mg Oral Daily  . furosemide  20 mg Oral Daily  . metoprolol tartrate  12.5 mg Oral BID  . pantoprazole  40 mg Oral QAC breakfast  . potassium chloride  10 mEq Oral Daily  . sodium chloride  3 mL Intravenous Q12H     Radiology/Studies:  Dg Chest 2 View  10/12/2012  *RADIOLOGY REPORT*  Clinical Data: Postop chest radiograph, cough, CABG  CHEST - 2 VIEW  Comparison: 10/11/2012; 10/10/2012; 10/03/2012  Findings:  Grossly unchanged cardiac silhouette and mediastinal contours post median sternotomy and CABG.  Grossly unchanged small right-sided hydropneumothorax.  Interval removal of left-sided chest tube.  No definite left-sided pneumothorax. Epicardial electrical leads persist.  Grossly unchanged small bilateral effusions and bibasilar heterogeneous opacities, left greater than right.  No new focal airspace opacities.  No evidence of pulmonary edema.  Unchanged bones.  IMPRESSION: 1.  Unchanged small right-sided hydropneumothorax. 2.  Interval removal of left-sided chest  tube.  No definite left- sided pneumothorax. 3.  Unchanged small bilateral effusions and bibasilar heterogeneous opacities, left greater than right, likely atelectasis   Original Report Authenticated By: Tacey Ruiz, MD     INR: Will add last result for INR, ABG once components are confirmed Will add last 4 CBG results once components are confirmed  Assessment/Plan: S/P Procedure(s) (LRB): CORONARY ARTERY BYPASS GRAFTING (CABG) (N/A) INTRAOPERATIVE TRANSESOPHAGEAL ECHOCARDIOGRAM (N/A)  1. Doing well overall 2 conts current rx/tx 3 poss home in am     LOS: 10 days    Abigail Solis,Abigail Solis 1/18/201410:21 AM    Poss home in am. I  have seen and examined Abigail Solis and agree with the above assessment  and plan.  Delight Ovens MD Beeper [phone] Office [phone] 10/13/2012 11:27 AM

## 2012-10-14 NOTE — Progress Notes (Signed)
Patient ambulated independently with her husband on room air approximately 600 ft without assistive devices. Patients vital signs stable. Patient returned to bed resting. Will continue to monitor.

## 2012-10-14 NOTE — Progress Notes (Signed)
CTs removed per Md order and protocol. Pt tolerated procedure well. Sterri strips applied.

## 2012-10-14 NOTE — Progress Notes (Signed)
Pt discharged per MD order and protocol. All discharge instructions reviewed with patient and family, all questions answered. Pt aware of all follow up appointments. Pt and family given Cardiac surgery packet and discharge video watched.

## 2012-10-14 NOTE — Progress Notes (Signed)
Patient did not have a bowel movement last night. Patient given a ducolax suppository @0609 . Will continue to monitor.

## 2012-10-14 NOTE — Progress Notes (Addendum)
                   [address]            [place] [address]          [phone]     5 Days Post-Op  Procedure(s) (LRB): CORONARY ARTERY BYPASS GRAFTING (CABG) (N/A) INTRAOPERATIVE TRANSESOPHAGEAL ECHOCARDIOGRAM (N/A) Subjective: Feels ok, anxious to go home  Objective  Telemetry sinus rhythm  Temp:  [99.1 F (37.3 C)-99.5 F (37.5 C)] 99.3 F (37.4 C) (01/19 0444) Pulse Rate:  [76-87] 79  (01/19 0444) Resp:  [16-18] 18  (01/19 0444) BP: (111-119)/(59-71) 114/70 mmHg (01/19 0444) SpO2:  [93 %-95 %] 94 % (01/19 0444) Weight:  [132 lb 4.8 oz (60.011 kg)] 132 lb 4.8 oz (60.011 kg) (01/19 0444)   Intake/Output Summary (Last 24 hours) at 10/14/12 0936 Last data filed at 10/14/12 0300  Gross per 24 hour  Intake    240 ml  Output   1400 ml  Net  -1160 ml       General appearance: alert, cooperative and no distress Heart: regular rate and rhythm Lungs: clear to auscultation bilaterally Abdomen: soft, non-tender; bowel sounds normal; no masses,  no organomegaly Extremities: no edema Wound: incisions healing well  Lab Results:  Basename 10/12/12 0500  NA 137  K 3.5  CL 103  CO2 23  GLUCOSE 120*  BUN 15  CREATININE 0.65  CALCIUM 8.4  MG --  PHOS --   No results found for this basename: AST:2,ALT:2,ALKPHOS:2,BILITOT:2,PROT:2,ALBUMIN:2 in the last 72 hours No results found for this basename: LIPASE:2,AMYLASE:2 in the last 72 hours  Basename 10/12/12 0500  WBC 8.5  NEUTROABS --  HGB 9.7*  HCT 29.8*  MCV 87.6  PLT 143*   No results found for this basename: CKTOTAL:4,CKMB:4,TROPONINI:4 in the last 72 hours No components found with this basename: POCBNP:3 No results found for this basename: DDIMER in the last 72 hours No results found for this basename: HGBA1C in the last 72 hours No results found for this basename: CHOL,HDL,LDLCALC,TRIG,CHOLHDL in the last 72 hours No results found for this basename: TSH,T4TOTAL,FREET3,T3FREE,THYROIDAB in  the last 72 hours No results found for this basename: VITAMINB12,FOLATE,FERRITIN,TIBC,IRON,RETICCTPCT in the last 72 hours  Medications: Scheduled    . aspirin  324 mg Oral Daily  . atorvastatin  40 mg Oral q1800  . docusate sodium  200 mg Oral Daily  . furosemide  20 mg Oral Daily  . metoprolol tartrate  12.5 mg Oral BID  . pantoprazole  40 mg Oral QAC breakfast  . polyethylene glycol  17 g Oral Daily  . potassium chloride  10 mEq Oral Daily  . sodium chloride  3 mL Intravenous Q12H     Radiology/Studies:  No results found.  INR: Will add last result for INR, ABG once components are confirmed Will add last 4 CBG results once components are confirmed  Assessment/Plan: S/P Procedure(s) (LRB): CORONARY ARTERY BYPASS GRAFTING (CABG) (N/A) INTRAOPERATIVE TRANSESOPHAGEAL ECHOCARDIOGRAM (N/A) Plan for discharge: see discharge orders   LOS: 11 days    Abigail Solis 1/19/20149:36 AM    homke today I have seen and examined Abigail Solis and agree with the above assessment  and plan.  Delight Ovens MD Beeper [phone] Office [phone] 10/14/2012 11:59 AM

## 2012-10-24 ENCOUNTER — Encounter: Payer: Self-pay | Admitting: Cardiovascular Disease

## 2012-10-24 ENCOUNTER — Encounter: Payer: Self-pay | Admitting: *Deleted

## 2012-10-26 ENCOUNTER — Other Ambulatory Visit: Payer: Self-pay | Admitting: *Deleted

## 2012-10-26 DIAGNOSIS — G8918 Other acute postprocedural pain: Secondary | ICD-10-CM

## 2012-10-26 MED ORDER — TRAMADOL HCL 50 MG PO TABS: 50.0000 mg | ORAL_TABLET | ORAL | Status: DC | PRN

## 2012-10-29 ENCOUNTER — Encounter: Payer: Self-pay | Admitting: Cardiovascular Disease

## 2012-10-29 ENCOUNTER — Ambulatory Visit (INDEPENDENT_AMBULATORY_CARE_PROVIDER_SITE_OTHER): Payer: Medicare Other | Admitting: Cardiovascular Disease

## 2012-10-29 VITALS — BP 122/70 | HR 93 | Ht 60.0 in | Wt 125.8 lb

## 2012-10-29 DIAGNOSIS — I214 Non-ST elevation (NSTEMI) myocardial infarction: Secondary | ICD-10-CM

## 2012-10-29 DIAGNOSIS — E782 Mixed hyperlipidemia: Secondary | ICD-10-CM

## 2012-10-29 NOTE — Assessment & Plan Note (Signed)
S/:P CABG doing well CXR and f/ut Gerhardt in 2 weeks

## 2012-10-29 NOTE — Progress Notes (Signed)
Patient ID: Abigail Solis, female   DOB: 05/01/1941, 72 y.o.   MRN: [id number] 72 yo presented with NSTEMI on 10/04/12  And required CABG  1.Coronary angiography done on 10/04/2012 by Dr. [name]:  Coronary dominance: right  Left mainstem: There is mild 20% narrowing at the ostium.  Left anterior descending (LAD): The LAD has an 80% stenosis immediately after the takeoff of the first diagonal followed by 70% stenosis in the mid vessel. The first diagonal is a large branch which has a 90% stenosis proximally. The segment of disease does extend to the ostium.  Left circumflex (LCx): The left circumflex is a large vessel. It gives rise to 3 marginal branches before terminating on the posterior lateral wall. There is 50% stenosis in the mid circumflex between the first and second marginal branches. There is 30% disease distal to the second OM.  Right coronary artery (RCA): The right coronary is a codominant vessel. It has a 90-95% stenosis in the proximal to mid vessel. This is a segmental stenosis.  Left ventriculography: Left ventricular systolic function is normal, LVEF is estimated at 6065%, there is a small focal area of inferior apical hypokinesis,there is no significant mitral regurgitation  2.Coronary artery bypass grafting x4 with the left  internal mammary to the left anterior descending coronary artery, reverse saphenous vein graft to the diagonal coronary artery, reverse saphenous vein graft to the distal circumflex, reverse saphenous vein graft to the distal right coronary artery with bilateral thigh and the vein harvesting by Dr. Tyrone Sage on 10/09/2012.  ROS: Denies fever, malais, weight loss, blurry vision, decreased visual acuity, cough, sputum, SOB, hemoptysis, pleuritic pain, palpitaitons, heartburn, abdominal pain, melena, lower extremity edema, claudication, or rash.  All other systems reviewed and negative  General: Affect appropriate Healthy:  appears stated age HEENT: normal Neck  supple with no adenopathy JVP normal no bruits no thyromegaly Lungs clear with no wheezing and good diaphragmatic motion Heart:  S1/S2 no murmur, no rub, gallop or click sternum well healed PMI normal Abdomen: benighn, BS positve, no tenderness, no AAA no bruit.  No HSM or HJR Distal pulses intact with no bruits No edema Neuro non-focal Skin warm and dry No muscular weakness   Current Outpatient Prescriptions  Medication Sig Dispense Refill  . aspirin 81 MG chewable tablet Chew 4 tablets (324 mg total) by mouth daily.      Marland Kitchen atorvastatin (LIPITOR) 40 MG tablet Take 1 tablet (40 mg total) by mouth daily at 6 PM.  30 tablet  1  . metoprolol tartrate (LOPRESSOR) 25 MG tablet Take 0.5 tablets (12.5 mg total) by mouth 2 (two) times daily.  30 tablet  1  . omeprazole (PRILOSEC) 20 MG capsule Take 20 mg by mouth daily.      . traMADol (ULTRAM) 50 MG tablet Take 1-2 tablets (50-100 mg total) by mouth every 4 (four) hours as needed for pain.  40 tablet  0    Allergies  Nsaids  Electrocardiogram: NSR nonspecific ST/T wave changes 10/15/12  Assessment and Plan

## 2012-10-29 NOTE — Assessment & Plan Note (Signed)
Cholesterol is at goal.  Continue current dose of statin and diet Rx.  No myalgias or side effects.  F/U  LFT's in 6 months. Lab Results  Component Value Date   LDLCALC 126* 10/04/2012

## 2012-10-29 NOTE — Patient Instructions (Signed)
Your physician recommends that you schedule a follow-up appointment in:  3 MONTHS WITH  DR NISHAN  Your physician recommends that you continue on your current medications as directed. Please refer to the Current Medication list given to you today.  

## 2012-11-06 ENCOUNTER — Other Ambulatory Visit: Payer: Self-pay

## 2012-11-06 ENCOUNTER — Other Ambulatory Visit: Payer: Self-pay | Admitting: *Deleted

## 2012-11-06 DIAGNOSIS — I2510 Atherosclerotic heart disease of native coronary artery without angina pectoris: Secondary | ICD-10-CM

## 2012-11-08 ENCOUNTER — Ambulatory Visit: Payer: Self-pay | Admitting: Cardiothoracic Surgery

## 2012-11-10 ENCOUNTER — Other Ambulatory Visit: Payer: Self-pay

## 2012-11-13 ENCOUNTER — Ambulatory Visit
Admission: RE | Admit: 2012-11-13 | Discharge: 2012-11-13 | Disposition: A | Discharge status: Home / Self Care | Payer: Medicare Other | Source: Ambulatory Visit | Attending: Cardiothoracic Surgery | Admitting: Cardiothoracic Surgery

## 2012-11-13 ENCOUNTER — Encounter: Payer: Self-pay | Admitting: Cardiothoracic Surgery

## 2012-11-13 ENCOUNTER — Ambulatory Visit (INDEPENDENT_AMBULATORY_CARE_PROVIDER_SITE_OTHER): Payer: Self-pay | Admitting: Cardiothoracic Surgery

## 2012-11-13 VITALS — BP 130/74 | HR 79 | Resp 16 | Ht 60.0 in | Wt 126.0 lb

## 2012-11-13 DIAGNOSIS — I2510 Atherosclerotic heart disease of native coronary artery without angina pectoris: Secondary | ICD-10-CM

## 2012-11-13 DIAGNOSIS — Z951 Presence of aortocoronary bypass graft: Secondary | ICD-10-CM

## 2012-11-13 NOTE — Patient Instructions (Addendum)
May start Cardiac Rehab   Coronary Artery Bypass Grafting  Care After  Refer to this sheet in the next few weeks. These instructions provide you with information on caring for yourself after your procedure. Your caregiver may also give you more specific instructions. Your treatment has been planned according to current medical practices, but problems sometimes occur. Call your caregiver if you have any problems or questions after your procedure.  Recovery from open heart surgery will be different for everyone. Some people feel well after 3 or 4 weeks, while for others it takes longer. After heart surgery, it may be normal to:  Not have an appetite, feel nauseated by the smell of food, or only want to eat a small amount.   Be constipated because of changes in your diet, activity, and medicines. Eat foods high in fiber. Add fresh fruits and vegetables to your diet. Stool softeners may be helpful.   Feel sad or unhappy. You may be frustrated or cranky. You may have good days and bad days. Do not give up. Talk to your caregiver if you do not feel better.   Feel weakness and fatigue. You many need physical therapy or cardiac rehabilitation to get your strength back.   Develop an irregular heartbeat called atrial fibrillation. Symptoms of atrial fibrillation are a fast, irregular heartbeat or feelings of fluttery heartbeats, shortness of breath, low blood pressure, and dizziness. If these symptoms develop, see your caregiver right away.  MEDICATION  Have a list of all the medicines you will be taking when you leave the hospital. For every medicine, know the following:   Name.   Exact dose.   Time of day to be taken.   How often it should be taken.   Why you are taking it.   Ask which medicines should or should not be taken together. If you take more than one heart medicine, ask if it is okay to take them together. Some heart medicines should not be taken at the same time because they may  lower your blood pressure too much.   Narcotic pain medicine can cause constipation. Eat fresh fruits and vegetables. Add fiber to your diet. Stool softener medicine may help relieve constipation.   Keep a copy of your medicines with you at all times.   Do not add or stop taking any medicine until you check with your caregiver.   Medicines can have side effects. Call your caregiver who prescribed the medicine if you:   Start throwing up, have diarrhea, or have stomach pain.   Feel dizzy or lightheaded when you stand up.   Feel your heart is skipping beats or is beating too fast or too slow.   Develop a rash.   Notice unusual bruising or bleeding.  HOME CARE INSTRUCTIONS  After heart surgery, it is important to learn how to take your pulse. Have your caregiver show you how to take your pulse.   Use your incentive spirometer. Ask your caregiver how long after surgery you need to use it.  Care of your chest incision  Tell your caregiver right away if you notice clicking in your chest (sternum).   Support your chest with a pillow or your arms when you take deep breaths and cough.   Follow your caregiver's instructions about when you can bathe or swim.   Protect your incision from sunlight during the first year to keep the scar from getting dark.   Tell your caregiver if you notice:   Increased  tenderness of your incision.   Increased redness or swelling around your incision.   Drainage or pus from your incision.  Care of your leg incision(s)  Avoid crossing your legs.   Avoid sitting for long periods of time. Change positions every half hour.   Elevate your leg(s) when you are sitting.   Check your leg(s) daily for swelling. Check the incisions for redness or drainage.   Diet is very important to heart health.   Eat plenty of fresh fruits and vegetables. Meats should be lean cut. Avoid canned, processed, and fried foods.   Talk to a dietician. They can teach you how  to make healthy food and drink choices.  Weight  Weigh yourself every day. This is important because it helps to know if you are retaining fluid that may make your heart and lungs work harder.   Use the same scale each time.   Weigh yourself every morning at the same time. You should do this after you go to the bathroom, but before you eat breakfast.   Your weight will be more accurate if you do not wear any clothes.   Record your weight.   Tell your caregiver if you have gained 2 pounds or more overnight.  Activity Stop any activity at once if you have chest pain, shortness of breath, irregular heartbeats, or dizziness. Get help right away if you have any of these symptoms.  Bathing.  Avoid soaking in a bath or hot tub until your incisions are healed.   Rest. You need a balance of rest and activity.   Exercise. Exercise per your caregiver's advice. You may need physical therapy or cardiac rehabilitation to help strengthen your muscles and build your endurance.   Climbing stairs. Unless your caregiver tells you not to climb stairs, go up stairs slowly and rest if you tire. Do not pull yourself up by the handrail.   Driving a car. Follow your caregiver's advice on when you may drive. You may ride as a passenger at any time. When traveling for long periods of time in a car, get out of the car and walk around for a few minutes every 2 hours.   Lifting. Avoid lifting, pushing, or pulling anything heavier than 10 pounds for 6 weeks after surgery or as told by your caregiver.   Returning to work. Check with your caregiver. People heal at different rates. Most people will be able to go back to work 6 to 12 weeks after surgery.   Sexual activity. You may resume sexual relations as told by your caregiver.  SEEK MEDICAL CARE IF:  Any of your incisions are red, painful, or have any type of drainage coming from them.   You have an oral temperature above 101.5 F .   You have ankle or leg  swelling.   You have pain in your legs.   You have weight gain of 2 or more pounds a day.   You feel dizzy or lightheaded when you stand up.  SEEK IMMEDIATE MEDICAL CARE IF:  You have angina or chest pain that goes to your jaw or arms. Call your local emergency services right away.   You have shortness of breath at rest or with activity.   You have a fast or irregular heartbeat (arrhythmia).   There is a "clicking" in your sternum when you move.   You have numbness or weakness in your arms or legs.  MAKE SURE YOU:  Understand these instructions.   Will  watch your condition.   Will get help right away if you are not doing well or get worse.    No lifting over 25 lbs for 3 months

## 2012-11-13 NOTE — Progress Notes (Addendum)
[address]            [place] [address]          [phone]       Abigail Solis [facility] Medical Record #[id number] Date of Birth: 07/08/1941  Wendall Stade, MD Dwana Melena, MD  Chief Complaint:   PostOp Follow Up Visit 10/09/2012  OPERATIVE REPORT  PREOPERATIVE DIAGNOSIS: Coronary occlusive disease with recent  subendocardial myocardial infarction.  POSTOPERATIVE DIAGNOSIS: Coronary occlusive disease with recent  subendocardial myocardial infarction.  SURGICAL PROCEDURE: Coronary artery bypass grafting x4 with the left  internal mammary to the left anterior descending coronary artery,  reverse saphenous vein graft to the diagonal coronary artery, reverse  saphenous vein graft to the distal circumflex, reverse saphenous vein  graft to the distal right coronary artery with bilateral thigh and the  vein harvesting.   History of Present Illness:     Doing well postop following CABG. She has had no angina or evidence of CHF. Complains of some numbness over left chest related to use of mammary artery. No pedal edema.  History  Smoking status  . Never Smoker   Smokeless tobacco  . Never Used       Allergies  Allergen Reactions  . Nsaids Hives and Itching    blisters    Current Outpatient Prescriptions  Medication Sig Dispense Refill  . aspirin 81 MG tablet Take 81 mg by mouth daily.      Marland Kitchen atorvastatin (LIPITOR) 40 MG tablet Take 1 tablet (40 mg total) by mouth daily at 6 PM.  30 tablet  1  . metoprolol tartrate (LOPRESSOR) 25 MG tablet Take 0.5 tablets (12.5 mg total) by mouth 2 (two) times daily.  30 tablet  1  . omeprazole (PRILOSEC) 20 MG capsule Take 20 mg by mouth daily.      . traMADol (ULTRAM) 50 MG tablet Take 1-2 tablets (50-100 mg total) by mouth every 4 (four) hours as needed for pain.  40 tablet  0   No current facility-administered medications for this visit.       Physical Exam: BP 130/74  Pulse 79   Resp 16  Ht 5' (1.524 m)  Wt 126 lb (57.153 kg)  BMI 24.61 kg/m2  SpO2 97%  General appearance: alert and cooperative Neurologic: intact Heart: regular rate and rhythm, S1, S2 normal, no murmur, click, rub or gallop and normal apical impulse Lungs: clear to auscultation bilaterally and normal percussion bilaterally Abdomen: soft, non-tender; bowel sounds normal; no masses,  no organomegaly Extremities: extremities normal, atraumatic, no cyanosis or edema and Homans sign is negative, no sign of DVT Wound: wound well healed   Diagnostic Studies & Laboratory data:         Recent Radiology Findings: Dg Chest 2 View  11/13/2012  *RADIOLOGY REPORT*  Clinical Data: Follow up CABG  CHEST - 2 VIEW  Comparison: Chest x-ray of 10/12/2012  Findings:  There is a small left pleural effusion present with mild left basilar atelectasis.  A vague opacity is present at the right lung base which is somewhat nodular.  This may be due to atelectasis with no abnormality seen in this region on prior chest x-ray.  Attention to this area is recommended on follow-up chest x- ray.  The heart is mildly enlarged and stable.  Median sternotomy sutures are noted from prior CABG.  Thoracolumbar  scoliosis again is noted.  IMPRESSION: 1.  Improved aeration with small left pleural effusion.  2.  Vague nodular density at the right lung base most likely due to atelectasis.  Recommend attention to this area on follow-up chest x- ray.   Original Report Authenticated By: Dwyane Dee, M.D.       Recent Labs: Lab Results  Component Value Date   WBC 8.5 10/12/2012   HGB 9.7* 10/12/2012   HCT 29.8* 10/12/2012   PLT 143* 10/12/2012   GLUCOSE 120* 10/12/2012   CHOL 195 10/04/2012   TRIG 65 10/04/2012   HDL 56 10/04/2012   LDLCALC 161* 10/04/2012   ALT 14 10/04/2012   AST 23 10/04/2012   NA 137 10/12/2012   K 3.5 10/12/2012   CL 103 10/12/2012   CREATININE 0.65 10/12/2012   BUN 15 10/12/2012   CO2 23 10/12/2012   TSH 2.214 10/04/2012   INR  1.43 10/09/2012   HGBA1C 6.0* 10/04/2012      Assessment / Plan:   Doing well post op Start cardiac rehab Follow up chest xray in one month       Latonga Ponder B 11/13/2012 3:16 PM

## 2012-11-14 ENCOUNTER — Other Ambulatory Visit: Payer: Self-pay | Admitting: *Deleted

## 2012-11-14 DIAGNOSIS — G8918 Other acute postprocedural pain: Secondary | ICD-10-CM

## 2012-11-14 MED ORDER — TRAMADOL HCL 50 MG PO TABS: 50.0000 mg | ORAL_TABLET | ORAL | Status: DC | PRN

## 2012-11-19 ENCOUNTER — Telehealth: Payer: Self-pay | Admitting: Cardiovascular Disease

## 2012-11-19 ENCOUNTER — Other Ambulatory Visit: Payer: Self-pay | Admitting: Cardiology

## 2012-11-19 ENCOUNTER — Telehealth: Payer: Self-pay | Admitting: Cardiology

## 2012-11-19 NOTE — Telephone Encounter (Signed)
In error

## 2012-11-19 NOTE — Telephone Encounter (Signed)
SPOKE WITH PT   STARTED COMPLAINING  WITH PAIN  TO  NECK RADIATING  UP INTO THE HEAD  NOTES PAIN IN THE EVENING  AND UPON AWAKENING HAS  BUT NOT AS SEVERE  THINKS  IT IS COMING FROM  ATORVASTATIN AS PT TAKES MED  AT 6:00 PM    HELD MED LAST NIGHT  AND FEELS MUCH BETTER WILL FORWARD TO DR Eden Emms FOR REVIEW .Zack Seal

## 2012-11-19 NOTE — Telephone Encounter (Signed)
Hold statin for two weeks and see if pain stays away

## 2012-11-19 NOTE — Telephone Encounter (Signed)
Follow Up    Following up on phone call she was supposed to receive from nurse in regards to one of her medications (atorvastatin). Would like to speak to nurse.

## 2012-11-20 NOTE — Telephone Encounter (Signed)
PT  AWARE./CY 

## 2012-11-20 NOTE — Telephone Encounter (Signed)
SEE OTHER PHONE NOTE./CY 

## 2012-11-22 ENCOUNTER — Encounter (HOSPITAL_COMMUNITY): Payer: Medicare Other

## 2012-11-29 ENCOUNTER — Encounter (HOSPITAL_COMMUNITY)
Admission: RE | Admit: 2012-11-29 | Discharge: 2012-11-29 | Disposition: A | Discharge status: Home / Self Care | Payer: Medicare Other | Source: Ambulatory Visit | Attending: Cardiovascular Disease | Admitting: Cardiovascular Disease

## 2012-12-05 ENCOUNTER — Other Ambulatory Visit: Payer: Self-pay | Admitting: *Deleted

## 2012-12-05 ENCOUNTER — Telehealth: Payer: Self-pay | Admitting: Cardiovascular Disease

## 2012-12-05 MED ORDER — METOPROLOL TARTRATE 25 MG PO TABS: 12.5000 mg | ORAL_TABLET | Freq: Two times a day (BID) | ORAL | Status: DC

## 2012-12-05 NOTE — Telephone Encounter (Signed)
New problem    Was advise to stop taken atorvastatin  40 mg  For 2 weeks . Then call Dr. Eden Emms office back

## 2012-12-05 NOTE — Telephone Encounter (Signed)
Pt called to report that after being off atorvastatin for 2+ weeks, she feels much better, no head or neck pain.

## 2012-12-06 NOTE — Telephone Encounter (Signed)
To Christine York. 

## 2012-12-06 NOTE — Telephone Encounter (Signed)
Check lipids in 6 weeks then we will make a decision about trying low dose simvastatin She just had CABG

## 2012-12-06 NOTE — Telephone Encounter (Signed)
PT AWARE  HAS F/U  WITH MD ON 02-05-13 WILL CHECK LIPIDS AT THAT TIME .Zack Seal

## 2012-12-12 ENCOUNTER — Other Ambulatory Visit: Payer: Self-pay | Admitting: *Deleted

## 2012-12-12 DIAGNOSIS — I2510 Atherosclerotic heart disease of native coronary artery without angina pectoris: Secondary | ICD-10-CM

## 2012-12-13 ENCOUNTER — Ambulatory Visit: Payer: Medicare Other | Admitting: Cardiothoracic Surgery

## 2012-12-14 ENCOUNTER — Encounter: Payer: Self-pay | Admitting: Cardiothoracic Surgery

## 2012-12-14 ENCOUNTER — Ambulatory Visit
Admission: RE | Admit: 2012-12-14 | Discharge: 2012-12-14 | Disposition: A | Discharge status: Home / Self Care | Payer: Medicare Other | Source: Ambulatory Visit | Attending: Cardiothoracic Surgery | Admitting: Cardiothoracic Surgery

## 2012-12-14 ENCOUNTER — Ambulatory Visit (INDEPENDENT_AMBULATORY_CARE_PROVIDER_SITE_OTHER): Payer: Self-pay | Admitting: Cardiothoracic Surgery

## 2012-12-14 VITALS — BP 147/78 | HR 69 | Resp 16 | Ht 60.0 in | Wt 126.0 lb

## 2012-12-14 DIAGNOSIS — I2510 Atherosclerotic heart disease of native coronary artery without angina pectoris: Secondary | ICD-10-CM

## 2012-12-14 DIAGNOSIS — Z951 Presence of aortocoronary bypass graft: Secondary | ICD-10-CM

## 2012-12-14 NOTE — Progress Notes (Addendum)
[address]       [place] [address]             [phone]         Abigail Solis [facility] Medical Record #[id number] Date of Birth: [birth year]  [name], [name], MD Dwana Melena, MD  Chief Complaint:   PostOp Follow Up Visit 10/09/2012  OPERATIVE REPORT  PREOPERATIVE DIAGNOSIS: Coronary occlusive disease with recent  subendocardial myocardial infarction.  POSTOPERATIVE DIAGNOSIS: Coronary occlusive disease with recent  subendocardial myocardial infarction.  SURGICAL PROCEDURE: Coronary artery bypass grafting x4 with the left  internal mammary to the left anterior descending coronary artery,  reverse saphenous vein graft to the diagonal coronary artery, reverse  saphenous vein graft to the distal circumflex, reverse saphenous vein  graft to the distal right coronary artery with bilateral thigh and the  vein harvesting.   History of Present Illness:     Doing well postop following CABG. She has had no angina or evidence of CHF. Complains of some numbness over left chest related to use of mammary artery. No pedal edema.  History  Smoking status  . Never Smoker   Smokeless tobacco  . Never Used       Allergies  Allergen Reactions  . Nsaids Hives and Itching    blisters    Current Outpatient Prescriptions  Medication Sig Dispense Refill  . aspirin 81 MG tablet Take 81 mg by mouth daily.      . metoprolol tartrate (LOPRESSOR) 25 MG tablet Take 0.5 tablets (12.5 mg total) by mouth 2 (two) times daily.  90 tablet  3   No current facility-administered medications for this visit.       Physical Exam: BP 147/78  Pulse 69  Resp 16  Ht 5' (1.524 m)  Wt 126 lb (57.153 kg)  BMI 24.61 kg/m2  SpO2 98%  General appearance: alert and cooperative Neurologic: intact Heart: regular rate and rhythm, S1, S2 normal, no murmur, click, rub or gallop and normal apical impulse Lungs: clear to auscultation bilaterally and normal  percussion bilaterally Abdomen: soft, non-tender; bowel sounds normal; no masses,  no organomegaly Extremities: extremities normal, atraumatic, no cyanosis or edema and Homans sign is negative, no sign of DVT Wound: wound well healed   Diagnostic Studies & Laboratory data:         Recent Radiology Findings: Dg Chest 2 View  12/14/2012  *RADIOLOGY REPORT*  Clinical Data: Atelectasis.  Recent CABG.  CHEST - 2 VIEW  Comparison: 11/14/2012  Findings: Heart size and vascularity are normal.  Atelectasis at the left base has completely cleared.  No pneumothorax.  Small nodular density at the right base has almost resolved and is felt to represent atelectasis. No effusions.  IMPRESSION: Almost complete resolution of the atelectasis.  No acute abnormalities.   Original Report Authenticated By: Francene Boyers, M.D.       Recent Labs: Lab Results  Component Value Date   WBC 8.5 10/12/2012   HGB 9.7* 10/12/2012   HCT 29.8* 10/12/2012   PLT 143* 10/12/2012   GLUCOSE 120* 10/12/2012   CHOL 195 10/04/2012   TRIG 65 10/04/2012   HDL 56 10/04/2012   LDLCALC 696* 10/04/2012   ALT 14 10/04/2012   AST 23 10/04/2012   NA 137 10/12/2012   K 3.5 10/12/2012   CL 103 10/12/2012   CREATININE 0.65 10/12/2012   BUN 15 10/12/2012   CO2 23 10/12/2012   TSH 2.214 10/04/2012   INR 1.43  10/09/2012   HGBA1C 6.0* 10/04/2012      Assessment / Plan:   Doing well post op Because of expense the patient has decided not to a cardiac rehabilitation She is currently not on any statin and his discuss this with cardiology, she noted neck and headache when she was taking Lipitor. Follow up chest xray in 3 month, to ensure there is complete resolution of the area of atelectasis question on her chest x-ray.     Abigail Solis B 12/14/2012 12:41 PM

## 2013-01-11 ENCOUNTER — Telehealth: Payer: Self-pay | Admitting: Cardiovascular Disease

## 2013-01-11 NOTE — Telephone Encounter (Signed)
Spoke with pt husband, the pt has been having severe headaches after taking metoprolol. This started a couple days ago. She did not take the metoprolol last night and did not get a headache. Her husband reports her bp and pulse remain normal with or without the metoprolol. Okay given for pt to remain off the metoprolol. Will forward for dr nishan's review and call them back next week. Pt husband agreed with this plan

## 2013-01-11 NOTE — Telephone Encounter (Signed)
New problem     Pt has been having headaches within 30 mins. Of taking metoprolol tartrate-wondering if she needs to stop taking it and if she needs to come in--per pt she's only taking metoprolol & an aspirin daily

## 2013-01-13 NOTE — Telephone Encounter (Signed)
Ok to stay off lopressor

## 2013-01-15 NOTE — Telephone Encounter (Signed)
Spoke with pt husband, aware of dr nishan's recommendations.

## 2013-02-05 ENCOUNTER — Ambulatory Visit (INDEPENDENT_AMBULATORY_CARE_PROVIDER_SITE_OTHER): Payer: Medicare Other | Admitting: Cardiovascular Disease

## 2013-02-05 VITALS — BP 132/83 | HR 86 | Wt 130.0 lb

## 2013-02-05 DIAGNOSIS — I509 Heart failure, unspecified: Secondary | ICD-10-CM

## 2013-02-05 DIAGNOSIS — E785 Hyperlipidemia, unspecified: Secondary | ICD-10-CM

## 2013-02-05 DIAGNOSIS — I214 Non-ST elevation (NSTEMI) myocardial infarction: Secondary | ICD-10-CM

## 2013-02-05 DIAGNOSIS — Z79899 Other long term (current) drug therapy: Secondary | ICD-10-CM

## 2013-02-05 DIAGNOSIS — E782 Mixed hyperlipidemia: Secondary | ICD-10-CM

## 2013-02-05 DIAGNOSIS — Z0000 Encounter for general adult medical examination without abnormal findings: Secondary | ICD-10-CM

## 2013-02-05 LAB — HEPATIC FUNCTION PANEL
Alkaline Phosphatase: 64 U/L (ref 39–117)
Bilirubin, Direct: 0 mg/dL (ref 0.0–0.3)
Total Protein: 9.7 g/dL — ABNORMAL HIGH (ref 6.0–8.3)

## 2013-02-05 LAB — LIPID PANEL
Cholesterol: 177 mg/dL (ref 0–200)
LDL Cholesterol: 129 mg/dL — ABNORMAL HIGH (ref 0–99)

## 2013-02-05 MED ORDER — ROSUVASTATIN CALCIUM 5 MG PO TABS: 5.0000 mg | ORAL_TABLET | Freq: Every day | ORAL | Status: DC

## 2013-02-05 NOTE — Patient Instructions (Addendum)
Your physician wants you to follow-up in:   6 MONTHS WITH DR Haywood Filler will receive a reminder letter in the mail two months in advance. If you don't receive a letter, please call our office to schedule the follow-up appointment. Your physician has recommended you make the following change in your medication: CRESTOR  5 MG  1 EVERY OTHER DAY  Your physician recommends that you return for lab work in: LIPID LIVER  HGB A1C

## 2013-02-05 NOTE — Assessment & Plan Note (Signed)
S/P CABG 1/14  Stable continue ASA

## 2013-02-05 NOTE — Progress Notes (Signed)
Patient ID: Abigail Solis, female   DOB: 02/20/1941, 72 y.o.   MRN: [id number]  72 yo presented with NSTEMI on 10/04/12 And required CABG  1.Coronary angiography done on 10/04/2012 by Dr. [name]:  Coronary dominance: right  Left mainstem: There is mild 20% narrowing at the ostium.  Left anterior descending (LAD): The LAD has an 80% stenosis immediately after the takeoff of the first diagonal followed by 70% stenosis in the mid vessel. The first diagonal is a large branch which has a 90% stenosis proximally. The segment of disease does extend to the ostium.  Left circumflex (LCx): The left circumflex is a large vessel. It gives rise to 3 marginal branches before terminating on the posterior lateral wall. There is 50% stenosis in the mid circumflex between the first and second marginal branches. There is 30% disease distal to the second OM.  Right coronary artery (RCA): The right coronary is a codominant vessel. It has a 90-95% stenosis in the proximal to mid vessel. This is a segmental stenosis.  Left ventriculography: Left ventricular systolic function is normal, LVEF is estimated at 6065%, there is a small focal area of inferior apical hypokinesis,there is no significant mitral regurgitation  2.Coronary artery bypass grafting x4 with the left  internal mammary to the left anterior descending coronary artery, reverse saphenous vein graft to the diagonal coronary artery, reverse saphenous vein graft to the distal circumflex, reverse saphenous vein graft to the distal right coronary artery with bilateral thigh and the vein harvesting by Dr. Tyrone Sage on 10/09/2012.  LDL in January was 126  Did not tolerate lipitor with headache.  Will try crestor qod  ROS: Denies fever, malais, weight loss, blurry vision, decreased visual acuity, cough, sputum, SOB, hemoptysis, pleuritic pain, palpitaitons, heartburn, abdominal pain, melena, lower extremity edema, claudication, or rash.  All other systems reviewed and  negative  General: Affect appropriate Healthy:  appears stated age HEENT: normal Neck supple with no adenopathy JVP normal no bruits no thyromegaly Lungs clear with no wheezing and good diaphragmatic motion Heart:  S1/S2 no murmur, no rub, gallop or click PMI normal Abdomen: benighn, BS positve, no tenderness, no AAA no bruit.  No HSM or HJR Distal pulses intact with no bruits No edema Neuro non-focal Skin warm and dry No muscular weakness   Current Outpatient Prescriptions  Medication Sig Dispense Refill  . aspirin 81 MG tablet Take 81 mg by mouth daily.       No current facility-administered medications for this visit.    Allergies  Nsaids  Electrocardiogram:  1/20 SR rate 70 anterolateral T wave changes  Assessment and Plan

## 2013-02-05 NOTE — Assessment & Plan Note (Signed)
LABS today Start crestor 5 mg qod  See if she tolerates

## 2013-02-05 NOTE — Assessment & Plan Note (Signed)
Resolved ischemia related  EF 55-60% on echo pre CABG

## 2013-03-21 ENCOUNTER — Ambulatory Visit (INDEPENDENT_AMBULATORY_CARE_PROVIDER_SITE_OTHER): Payer: Medicare Other | Admitting: Cardiothoracic Surgery

## 2013-03-21 ENCOUNTER — Ambulatory Visit
Admission: RE | Admit: 2013-03-21 | Discharge: 2013-03-21 | Disposition: A | Discharge status: Home / Self Care | Payer: Medicare Other | Source: Ambulatory Visit | Attending: Cardiothoracic Surgery | Admitting: Cardiothoracic Surgery

## 2013-03-21 ENCOUNTER — Other Ambulatory Visit: Payer: Self-pay | Admitting: *Deleted

## 2013-03-21 ENCOUNTER — Encounter: Payer: Self-pay | Admitting: Cardiothoracic Surgery

## 2013-03-21 VITALS — BP 144/85 | HR 78 | Resp 16 | Ht 60.0 in | Wt 130.0 lb

## 2013-03-21 DIAGNOSIS — I2510 Atherosclerotic heart disease of native coronary artery without angina pectoris: Secondary | ICD-10-CM

## 2013-03-21 DIAGNOSIS — Z951 Presence of aortocoronary bypass graft: Secondary | ICD-10-CM

## 2013-03-21 NOTE — Patient Instructions (Signed)
Chest xray looks good Discuss statins with cardiology

## 2013-03-21 NOTE — Progress Notes (Signed)
[address]       [place] [address]             [phone]                   Abigail Solis [facility] Medical Record #[id number] Date of Birth: May 12, 1941  [name], [name], MD Catalina Pizza, MD  Chief Complaint:   PostOp Follow Up Visit 10/09/2012  OPERATIVE REPORT  PREOPERATIVE DIAGNOSIS: Coronary occlusive disease with recent  subendocardial myocardial infarction.  POSTOPERATIVE DIAGNOSIS: Coronary occlusive disease with recent  subendocardial myocardial infarction.  SURGICAL PROCEDURE: Coronary artery bypass grafting x4 with the left  internal mammary to the left anterior descending coronary artery,  reverse saphenous vein graft to the diagonal coronary artery, reverse  saphenous vein graft to the distal circumflex, reverse saphenous vein  graft to the distal right coronary artery with bilateral thigh and the  vein harvesting.   History of Present Illness:    Walks daily without angina. Has stopped taking statin. No evidence of heart failure        History  Smoking status  . Never Smoker   Smokeless tobacco  . Never Used       Allergies  Allergen Reactions  . Lipitor (Atorvastatin) Other (See Comments)    HEADACHED  . Nsaids Hives and Itching    blisters    Current Outpatient Prescriptions  Medication Sig Dispense Refill  . aspirin 81 MG tablet Take 81 mg by mouth daily.      . rosuvastatin (CRESTOR) 5 MG tablet Take 1 tablet (5 mg total) by mouth daily.  30 tablet  11   No current facility-administered medications for this visit.       Physical Exam: BP 144/85  Pulse 78  Resp 16  Ht 5' (1.524 m)  Wt 130 lb (58.968 kg)  BMI 25.39 kg/m2  SpO2 98%  General appearance: alert and cooperative Neurologic: intact Heart: regular rate and rhythm, S1, S2 normal, no murmur, click, rub or gallop and normal apical impulse Lungs: clear to auscultation bilaterally and normal percussion bilaterally Abdomen: soft,  non-tender; bowel sounds normal; no masses,  no organomegaly Extremities: extremities normal, atraumatic, no cyanosis or edema and Homans sign is negative, no sign of DVT Wound: wound well healed   Diagnostic Studies & Laboratory data:         Recent Radiology Findings: Dg Chest 2 View  03/21/2013   *RADIOLOGY REPORT*  Clinical Data: Status post bypass surgery.  CHEST - 2 VIEW  Comparison: 12/14/2012  Findings: The cardiac silhouette, mediastinal and hilar contours are normal.  Stable surgical changes from triple bypass surgery. The lungs are clear.  No pleural effusion.  Stable mild eventration of the right hemidiaphragm.  The bony thorax is intact.  IMPRESSION: No acute cardiopulmonary findings.   Original Report Authenticated By: Rudie Meyer, M.D.      Recent Labs: Lab Results  Component Value Date   WBC 8.5 10/12/2012   HGB 9.7* 10/12/2012   HCT 29.8* 10/12/2012   PLT 143* 10/12/2012   GLUCOSE 120* 10/12/2012   CHOL 177 02/05/2013   TRIG 63.0 02/05/2013   HDL 35.10* 02/05/2013   LDLCALC 129* 02/05/2013   ALT 22 02/05/2013   AST 23 02/05/2013   NA 137 10/12/2012   K 3.5 10/12/2012   CL 103 10/12/2012   CREATININE 0.65 10/12/2012   BUN 15 10/12/2012   CO2 23 10/12/2012   TSH 2.214  10/04/2012   INR 1.43 10/09/2012   HGBA1C 6.3 02/05/2013      Assessment / Plan:   Doing well post op Because of expense the patient has decided not to a cardiac rehabilitation She is currently not on any statin and will discuss this with cardiology, she noted neck arm, left leg pain and headache when she was taking Lipitor and Cestor  Follow up chest xray today , shows complete resolution of the area of atelectasis question on her chest x-ray while in hospital Will see as referred by cardiology    Lazar Tierce B 03/21/2013 11:08 AM

## 2013-03-25 ENCOUNTER — Telehealth: Payer: Self-pay | Admitting: Cardiovascular Disease

## 2013-03-25 NOTE — Telephone Encounter (Signed)
New problem      please advise on if patient should stay on crestor or start something new due to pain in legs & arm.

## 2013-03-25 NOTE — Telephone Encounter (Signed)
PER PT WAS RECENTLY STARTED ON CRESTOR   5 MG EVERY OTHER  DAY AND AFTER TAKING  FOR A COUPLE OF WEEKS NOTED  LEG AND ARM  PAIN STOPPED APPROX 7 DAYS AGO AND PAIN  HAS GONE AWAY WILL FORWARD TO DR Eden Emms FOR HIS REVIEW   HAS TRIED  LIPITOR IN PAST AND DID NOT TOLERATE AS WELL./CY

## 2013-03-26 NOTE — Telephone Encounter (Signed)
Stop statin and have her seen in lipid clinic

## 2013-03-28 NOTE — Telephone Encounter (Signed)
Returned call to patient she stated she was calling back on what to do about crestor causing arm and leg pain.Dr.Nishan advised to stop crestor.Schedulers will be calling back to schedule appointment with lipid clinic.

## 2013-03-28 NOTE — Telephone Encounter (Signed)
Follow Up     States pt has stopped taking CRESTOR and pt would like to know how to move forward. Please call.

## 2013-04-01 ENCOUNTER — Other Ambulatory Visit: Payer: Self-pay | Admitting: Pharmacist

## 2013-04-01 DIAGNOSIS — E782 Mixed hyperlipidemia: Secondary | ICD-10-CM

## 2013-04-02 ENCOUNTER — Ambulatory Visit: Payer: Medicare Other | Admitting: Pharmacist

## 2013-04-03 ENCOUNTER — Other Ambulatory Visit (INDEPENDENT_AMBULATORY_CARE_PROVIDER_SITE_OTHER): Payer: Medicare Other

## 2013-04-03 DIAGNOSIS — E782 Mixed hyperlipidemia: Secondary | ICD-10-CM

## 2013-04-03 LAB — LIPID PANEL
HDL: 38.2 mg/dL — ABNORMAL LOW (ref >39.00)
Total CHOL/HDL Ratio: 5
VLDL: 14.2 mg/dL (ref 0.0–40.0)

## 2013-04-03 LAB — HEPATIC FUNCTION PANEL
AST: 23 U/L (ref 0–37)
Total Bilirubin: 0.4 mg/dL (ref 0.3–1.2)

## 2013-04-08 ENCOUNTER — Other Ambulatory Visit: Payer: Self-pay | Admitting: *Deleted

## 2013-04-08 ENCOUNTER — Telehealth: Payer: Self-pay | Admitting: Cardiovascular Disease

## 2013-04-08 DIAGNOSIS — R779 Abnormality of plasma protein, unspecified: Secondary | ICD-10-CM

## 2013-04-08 DIAGNOSIS — E8809 Other disorders of plasma-protein metabolism, not elsewhere classified: Secondary | ICD-10-CM

## 2013-04-08 NOTE — Telephone Encounter (Signed)
New problem  Pt daughter wants to speak you regarding the results of her mother lab test.

## 2013-04-09 NOTE — Telephone Encounter (Signed)
Follow up  Pt daughter is calling regarding her moms lab work. She also wants to know what kind of lab test are being done on tomorrow.

## 2013-04-09 NOTE — Telephone Encounter (Signed)
DAUGHTER CALLED  WITH QUESTIONS   RE PT'S LAB RESULTS    SEE  RESULT NOTES ON  LAB  REVIEWED  IN DETAIL WITH  DAUGHTER  DR NISHAN'S RECOMMENDATIONS./CY

## 2013-04-10 ENCOUNTER — Ambulatory Visit (INDEPENDENT_AMBULATORY_CARE_PROVIDER_SITE_OTHER): Payer: Medicare Other | Admitting: Pharmacist

## 2013-04-10 ENCOUNTER — Other Ambulatory Visit: Payer: Medicare Other

## 2013-04-10 VITALS — Wt 128.2 lb

## 2013-04-10 DIAGNOSIS — E782 Mixed hyperlipidemia: Secondary | ICD-10-CM

## 2013-04-10 MED ORDER — ROSUVASTATIN CALCIUM 5 MG PO TABS: ORAL_TABLET | ORAL | Status: DC

## 2013-04-10 NOTE — Patient Instructions (Signed)
Start Crestor 2.5mg  three days of the week.   Keep walking on a daily basis.   Recheck labs in 2 months.

## 2013-04-11 NOTE — Progress Notes (Signed)
HPI  Abigail Solis is a 72 yo F patient of Dr. Eden Emms.  She has a history of CABG in January 2014; otherwise healthy.  Family history significant for her father who died at 68 with questionable heart attack.  She has tried several statins to treat cholesterol since her event in January.  She took Lipitor 40mg  daily from Jan-Mar but then stopped due to severe neck and head pain.  She was started on Crestor 5mg  every other day in May but stopped in mid-June due to leg pain.  She has not ever tried any other medications for cholesterol.  All of her current lipid panels are when she has been off medication so unsure what type of results were achieved with Lipitor and Crestor.    Pt lives a healthy life.  She does not eat any fried foods.  She may have oatmeal or cereal for breakfast, a sandwich for lunch then a grilled meat with vegetables for dinner.  She has cut down on her cards since Jan as well.  She does like to walk on a daily basis.  She was walking 2 miles a day last year but has been able to work herself back up to 1 mile/day at this time.  States it takes her about 20-25 minutes to walk the mile.   Current Outpatient Prescriptions on File Prior to Visit  Medication Sig Dispense Refill  . aspirin 81 MG tablet Take 81 mg by mouth daily.       No current facility-administered medications on file prior to visit.    Allergies  Allergen Reactions  . Lipitor (Atorvastatin) Other (See Comments)    HEADACHED  . Nsaids Hives and Itching    blisters

## 2013-04-11 NOTE — Assessment & Plan Note (Signed)
TC- 177, TG- 71, HDL- 38, LDL- 125 (goal<70), LFTS are WNL.  Discussed importance of taking a statin of some sort with patient to prevent another event.  She is agreeable to try again.  Discussed pravastatin versus a lower dose of Crestor in hopes of avoiding muscle aches.  Pt would prefer to try lower dose of Crestor first then proceed to pravastatin if there are additional tolerability issues.  Will start with Crestor 2.5mg  three days of the week then titrate as needed.  Will recheck labs in 2 months.  Continue current exercise and diet routine.

## 2013-05-01 ENCOUNTER — Other Ambulatory Visit: Payer: Self-pay

## 2013-05-06 ENCOUNTER — Telehealth: Payer: Self-pay | Admitting: Cardiovascular Disease

## 2013-05-06 NOTE — Telephone Encounter (Signed)
New Message    Medication is not working out.. Crestor is causing pain. Pt request to spreak with sally

## 2013-05-07 NOTE — Telephone Encounter (Signed)
Spoke with pt.  She has been taking Crestor every other day.  Her legs started hurting last week so she held her dose on Friday and Monday.  Pain has resolved.  Wants to stay off statins at this point.  Agree that she is likely intolerant to all statins.  Will keep appt in Sept to discuss other options.

## 2013-06-10 ENCOUNTER — Ambulatory Visit (INDEPENDENT_AMBULATORY_CARE_PROVIDER_SITE_OTHER): Payer: Medicare Other | Admitting: Cardiovascular Disease

## 2013-06-10 DIAGNOSIS — I509 Heart failure, unspecified: Secondary | ICD-10-CM

## 2013-06-10 LAB — HEPATIC FUNCTION PANEL
Bilirubin, Direct: 0.1 mg/dL (ref 0.0–0.3)
Total Bilirubin: 0.5 mg/dL (ref 0.3–1.2)
Total Protein: 9.3 g/dL — ABNORMAL HIGH (ref 6.0–8.3)

## 2013-06-10 LAB — LIPID PANEL
LDL Cholesterol: 146 mg/dL — ABNORMAL HIGH (ref 0–99)
VLDL: 12.8 mg/dL (ref 0.0–40.0)

## 2013-06-12 ENCOUNTER — Ambulatory Visit (INDEPENDENT_AMBULATORY_CARE_PROVIDER_SITE_OTHER): Payer: Medicare Other | Admitting: Pharmacist

## 2013-06-12 DIAGNOSIS — E782 Mixed hyperlipidemia: Secondary | ICD-10-CM

## 2013-06-13 MED ORDER — FENOFIBRATE 160 MG PO TABS: 160.0000 mg | ORAL_TABLET | Freq: Every day | ORAL | Status: DC

## 2013-06-13 NOTE — Assessment & Plan Note (Signed)
Patient's lipids are at goal except remains LDL >100 and HDL<40.  We discussed ways to improve her diet and she agreed to try to cut back on some of the french fries. She also agreed to try fenofibrate 160mg  daily. We will recheck labs in November with Dr. Fabio Bering visit.

## 2013-06-13 NOTE — Progress Notes (Signed)
HPI  Abigail Solis is a 72 yo F patient of Dr. Eden Emms.  She has a history of CABG in January 2014; otherwise healthy. She has tried several statins to treat cholesterol since her event in January.  She most recently has tried Crestor 2.5mg  three times weekly; however she still experienced muscle pains in her neck, legs, and arms.  She has not tried any other medications for cholesterol.  Her current lipid panels are while off statin therapy and we are unsure the benefit received while on therapy.  During her visit today we discussed the alternative therapy options (fenofibrate, niacin, and zetia) and the side effect profiles with each of these agents.   Pt lives a fairly healthy lifestyle.  She rarely eats breakfast, but when she does she has a bowl of cereal (minifrosted wheats or raisin brand) and 2% milk.  For lunch she usually has a burger or BBQ and fries when they eat out.  She sometimes will eat a tomato sandwich or peanut butter and crackers if she eats at home. For dinner she usually has grilled chicken and a salad or baked potato. Occasionally, she will eat slow churned ice cream for an after dinner snack. She mostly drink tea and water. She does walk about a mile per day outside or on the treadmill, which takes about 25 minutes. She states that she is active during the day with her granddaughter and stays busy.   Current Outpatient Prescriptions on File Prior to Visit  Medication Sig Dispense Refill  . aspirin 81 MG tablet Take 81 mg by mouth daily.       No current facility-administered medications on file prior to visit.    Allergies  Allergen Reactions  . Lipitor [Atorvastatin] Other (See Comments)    HEADACHED  . Nsaids Hives and Itching    blisters

## 2013-06-26 ENCOUNTER — Telehealth: Payer: Self-pay | Admitting: Cardiovascular Disease

## 2013-06-26 NOTE — Telephone Encounter (Signed)
New problem   Pt stated she was to call Kennon Rounds if her medication isn't work that was prescribe to her. Please call pt

## 2013-06-26 NOTE — Telephone Encounter (Signed)
Spoke with pt.  She was having pains in the left leg and arm and slight headache as well as some weakness.  She stopped taking her fenofibrate last week and these things have resolved.  Discussed other possible options for therapy.  Pt states she has made multiple lifestyle changes and would like to try this rather than medication at this point.  She has an appt in November to recheck her labs.

## 2013-08-01 ENCOUNTER — Other Ambulatory Visit: Payer: Self-pay

## 2013-08-02 ENCOUNTER — Other Ambulatory Visit (INDEPENDENT_AMBULATORY_CARE_PROVIDER_SITE_OTHER): Payer: Medicare Other

## 2013-08-02 DIAGNOSIS — E782 Mixed hyperlipidemia: Secondary | ICD-10-CM

## 2013-08-02 LAB — HEPATIC FUNCTION PANEL
AST: 30 U/L (ref 0–37)
Albumin: 3.8 g/dL (ref 3.5–5.2)

## 2013-08-02 LAB — LIPID PANEL
HDL: 40.8 mg/dL (ref >39.00)
Total CHOL/HDL Ratio: 4
Triglycerides: 59 mg/dL (ref 0.0–149.0)

## 2013-08-05 ENCOUNTER — Ambulatory Visit: Payer: Medicare Other | Admitting: Cardiovascular Disease

## 2013-08-05 ENCOUNTER — Ambulatory Visit (INDEPENDENT_AMBULATORY_CARE_PROVIDER_SITE_OTHER): Payer: Medicare Other | Admitting: Pharmacist

## 2013-08-05 ENCOUNTER — Encounter: Payer: Self-pay | Admitting: Cardiovascular Disease

## 2013-08-05 ENCOUNTER — Ambulatory Visit (INDEPENDENT_AMBULATORY_CARE_PROVIDER_SITE_OTHER): Payer: Medicare Other | Admitting: Cardiovascular Disease

## 2013-08-05 VITALS — BP 120/80 | HR 70 | Resp 11

## 2013-08-05 DIAGNOSIS — E782 Mixed hyperlipidemia: Secondary | ICD-10-CM

## 2013-08-05 DIAGNOSIS — Z951 Presence of aortocoronary bypass graft: Secondary | ICD-10-CM | POA: Insufficient documentation

## 2013-08-05 NOTE — Progress Notes (Signed)
Patient ID: Abigail Solis, female   DOB: October 06, 1940, 72 y.o.   MRN: [id number] Abigail Solis is a 72 yo F patient of Dr. Eden Emms. She has a history of CABG in January 2014; otherwise healthy. Family history significant for her father who died at 41 with questionable heart attack. She has tried several statins to treat cholesterol since her event in January. She took Lipitor 40mg  daily from [place] but then stopped due to severe neck and head pain. She was started on Crestor 5mg  every other day in May but stopped in mid-June due to leg pain. She has not ever tried any other medications for cholesterol. All of her current lipid panels are when she has been off medication so unsure what type of results were achieved with Lipitor and Crestor.  Pt lives a healthy life. She does not eat any fried foods. She may have oatmeal or cereal for breakfast, a sandwich for lunch then a grilled meat with vegetables for dinner. She has cut down on her cards since Jan as well. She does like to walk on a daily basis. She was walking 2 miles a day last year but has been able to work herself back up to 1 mile/day at this time. States it takes her about 20-25 minutes to walk the mile.   Subsequently failed crestor 2.5 three days / week Failed fenofibrate as well Both caused headache , fatigue and left leg pain  LDL 128 and HDL 40  08/02/13      ROS: Denies fever, malais, weight loss, blurry vision, decreased visual acuity, cough, sputum, SOB, hemoptysis, pleuritic pain, palpitaitons, heartburn, abdominal pain, melena, lower extremity edema, claudication, or rash.  All other systems reviewed and negative  General: Affect appropriate Healthy:  appears stated age HEENT: normal Neck supple with no adenopathy JVP normal no bruits no thyromegaly Lungs clear with no wheezing and good diaphragmatic motion Heart:  S1/S2 no murmur, no rub, gallop or click PMI normal Abdomen: benighn, BS positve, no tenderness, no AAA no  bruit.  No HSM or HJR Distal pulses intact with no bruits No edema Neuro non-focal Skin warm and dry No muscular weakness   Current Outpatient Prescriptions  Medication Sig Dispense Refill  . aspirin 81 MG tablet Take 81 mg by mouth daily.       No current facility-administered medications for this visit.    Allergies  Lipitor; Nsaids; and Crestor  Electrocardiogram: 10/15/12 SR rate 79 anterolateral T wave inversions   Assessment and Plan

## 2013-08-05 NOTE — Assessment & Plan Note (Signed)
Statin and fenofibrate failure F/U Abigail Solis Don't think niacin worth trying May be research trial in future she can participate in

## 2013-08-05 NOTE — Assessment & Plan Note (Signed)
Stable with no angina and good activity level.  Continue medical Rx  

## 2013-08-05 NOTE — Patient Instructions (Signed)
Your physician wants you to follow-up in: YEAR WITH DR NISHAN  You will receive a reminder letter in the mail two months in advance. If you don't receive a letter, please call our office to schedule the follow-up appointment.  Your physician recommends that you continue on your current medications as directed. Please refer to the Current Medication list given to you today. 

## 2013-08-15 NOTE — Assessment & Plan Note (Signed)
Pt's cholesterol remains stable while off therapy.  TC- 181, TG- 59, HDL- 41, LDL- 128 (goal<70).  LFTs are WNL.  Discussed trying additional therapy versus maintaining these numbers with diet and exercise.  We have no labs while pt is on therapy.  She is willing to try Crestor 1 day of the week.  If she is able to tolerate this, she will let me know and we will check her labs.  If not, she will remain off therapy and follow up with Dr. Eden Emms as planned.

## 2013-08-15 NOTE — Progress Notes (Signed)
HPI  Abigail Solis is a 72 yo F patient of Dr. Eden Emms.  She has a history of CABG in January 2014; otherwise healthy.  Family history significant for her father who died at 82 with questionable heart attack.  She has tried several statins to treat cholesterol since her event in January.  She took Lipitor 40mg  daily from Jan-Mar but then stopped due to severe neck and head pain.  She was started on Crestor 5mg  every other day in May but stopped in mid-June due to leg pain.  She has not ever tried any other medications for cholesterol.  All of her current lipid panels are when she has been off medication so unsure what type of results were achieved with Lipitor and Crestor.    At last visit she agreed to try fenofibrate.  After taking this for 2-3 weeks she began having similar pains in her legs and neck.  She stopped this the first week of October.   Pt lives a healthy life.  She does not eat any fried foods.  She may have oatmeal or cereal for breakfast, a sandwich for lunch then a grilled meat with vegetables for dinner.  She has cut down on her carbs and fats.  She limits her fats to 25gm a day.  She does like to walk on a daily basis.  She was walking 2 miles a day last year but has been able to work herself back up to 1 mile/day at this time.  States it takes her about 20-25 minutes to walk the mile.   Current Outpatient Prescriptions on File Prior to Visit  Medication Sig Dispense Refill  . aspirin 81 MG tablet Take 81 mg by mouth daily.       No current facility-administered medications on file prior to visit.    Allergies  Allergen Reactions  . Lipitor [Atorvastatin] Other (See Comments)    HEADACHED  . Nsaids Hives and Itching    blisters  . Crestor [Rosuvastatin] Other (See Comments)    myalgias

## 2014-01-28 ENCOUNTER — Telehealth: Payer: Self-pay | Admitting: Cardiovascular Disease

## 2014-01-28 NOTE — Telephone Encounter (Signed)
New message     Pt had heart bypass surgery jan 2014.  Have a dental appt to have cleaning tomorrow.  Will she need an antibiotic?

## 2014-01-28 NOTE — Telephone Encounter (Signed)
Pt called to make sure if she needs antibiotics prior dental cleaning. Pt had CABG's in January 2014. Pt is aware that it has been a year since surgery so, she does not need antibiotics prior dental cleaning. Pt verbalized understanding.

## 2014-03-06 IMAGING — CR DG CHEST 2V
2 series · 2 of 2 positions shown · non-contrast
Comparison: None.

CLINICAL DATA: Hypertension and tachycardia.

CHEST - 2 VIEW

[w chest pa]
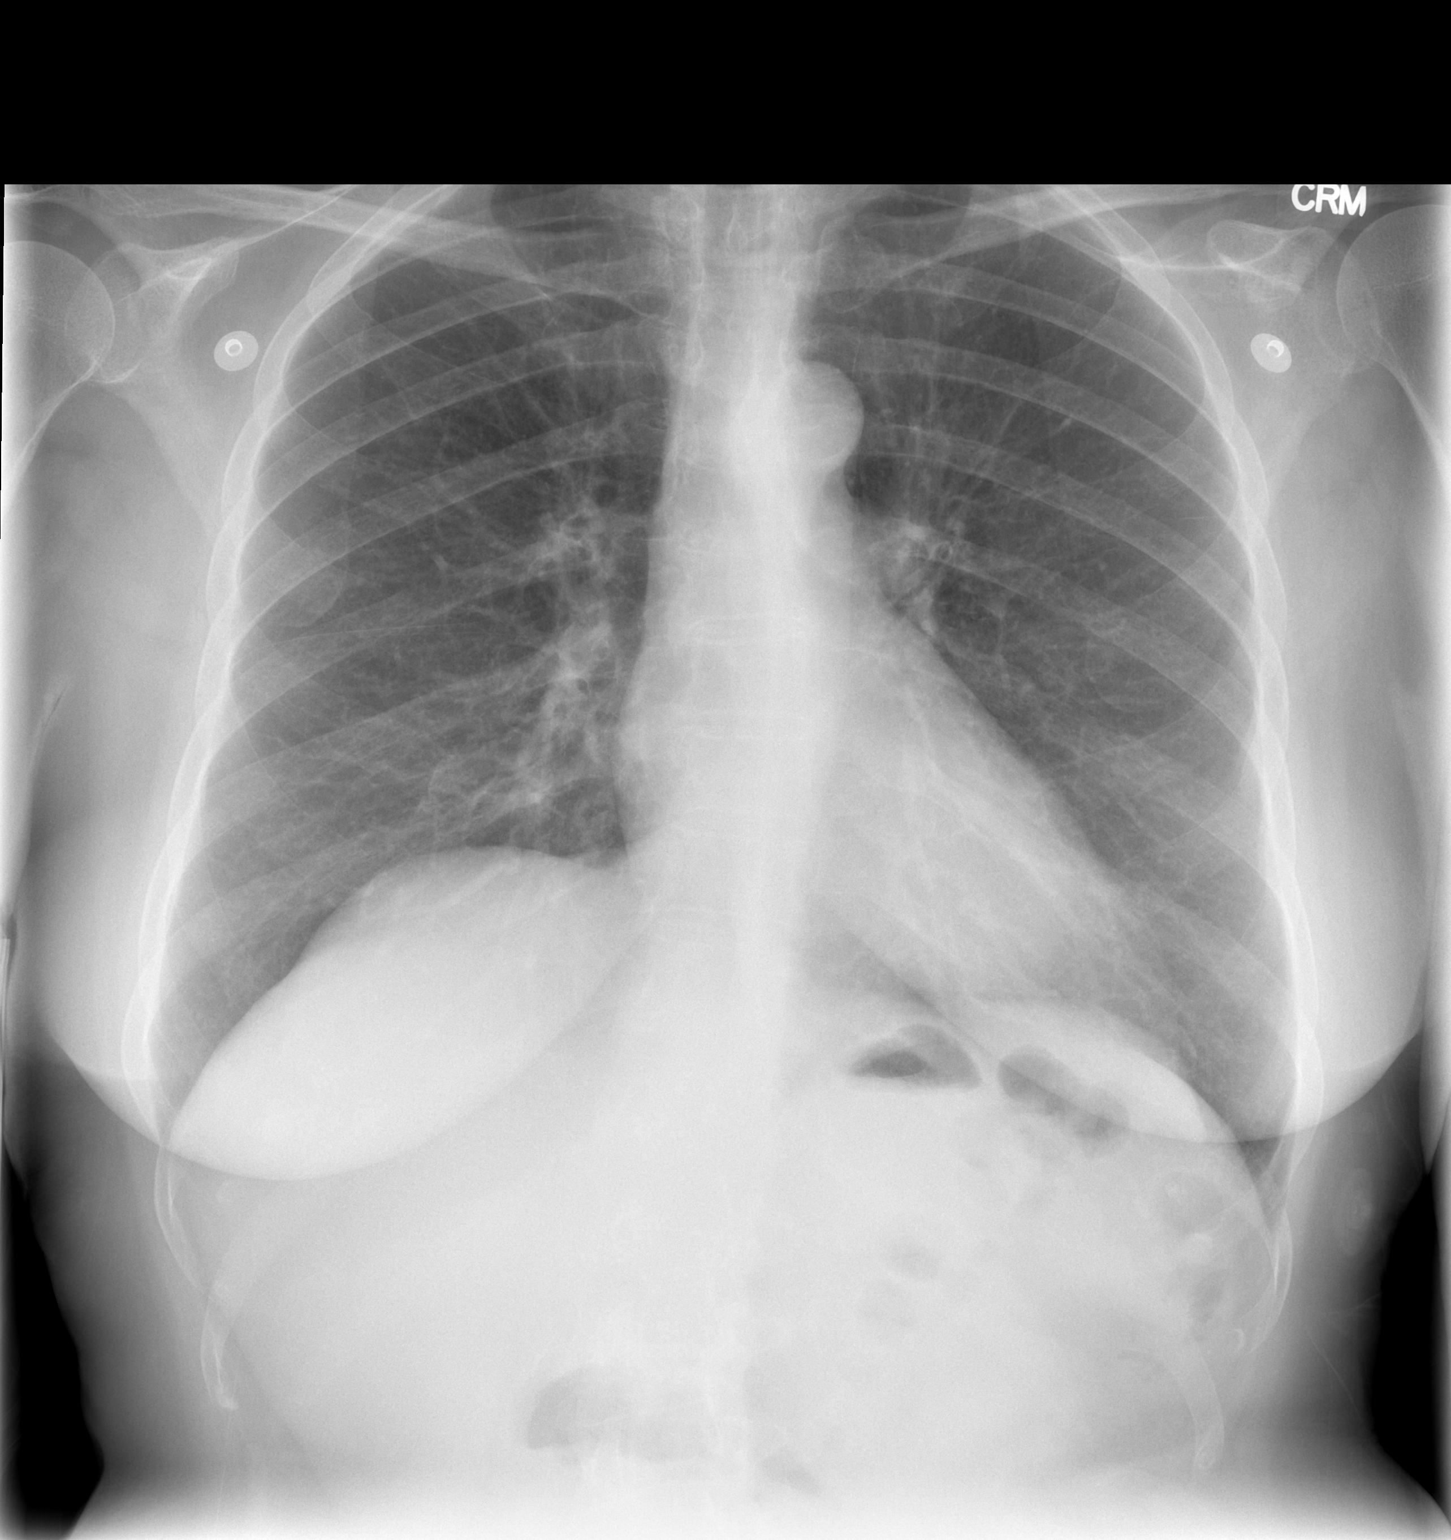

[w chest lat]
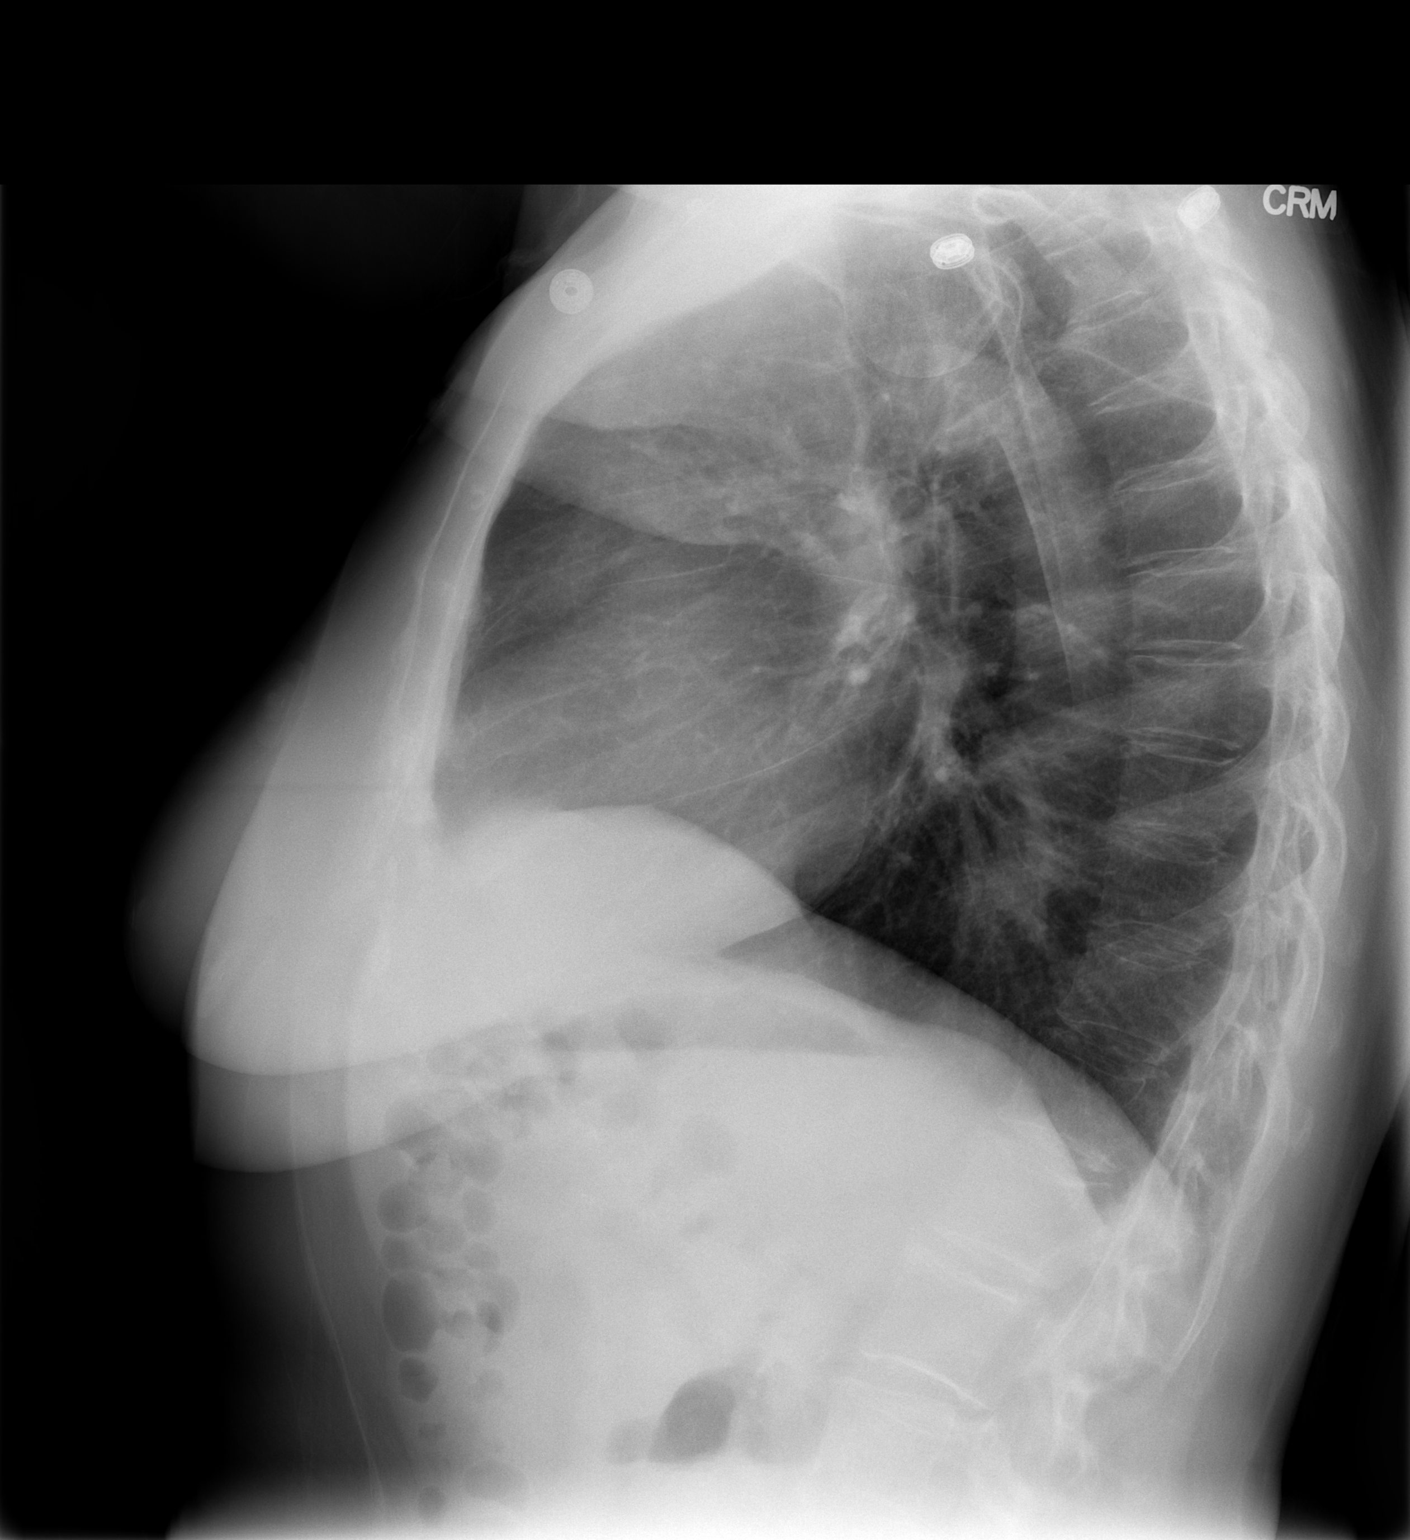

[2 of 2 positions shown; findings below may reference images not displayed]

FINDINGS: Lungs are clear.  Heart size is normal.  No pneumothorax
or pleural fluid.
IMPRESSION: No acute disease.

## 2014-03-14 IMAGING — CR DG CHEST 1V PORT
1 series · 1 of 1 positions shown · non-contrast
Comparison: Prior chest x-ray 10/10/2012

CLINICAL DATA: Postoperative evaluation, left chest tube

PORTABLE CHEST - 1 VIEW

[AP]
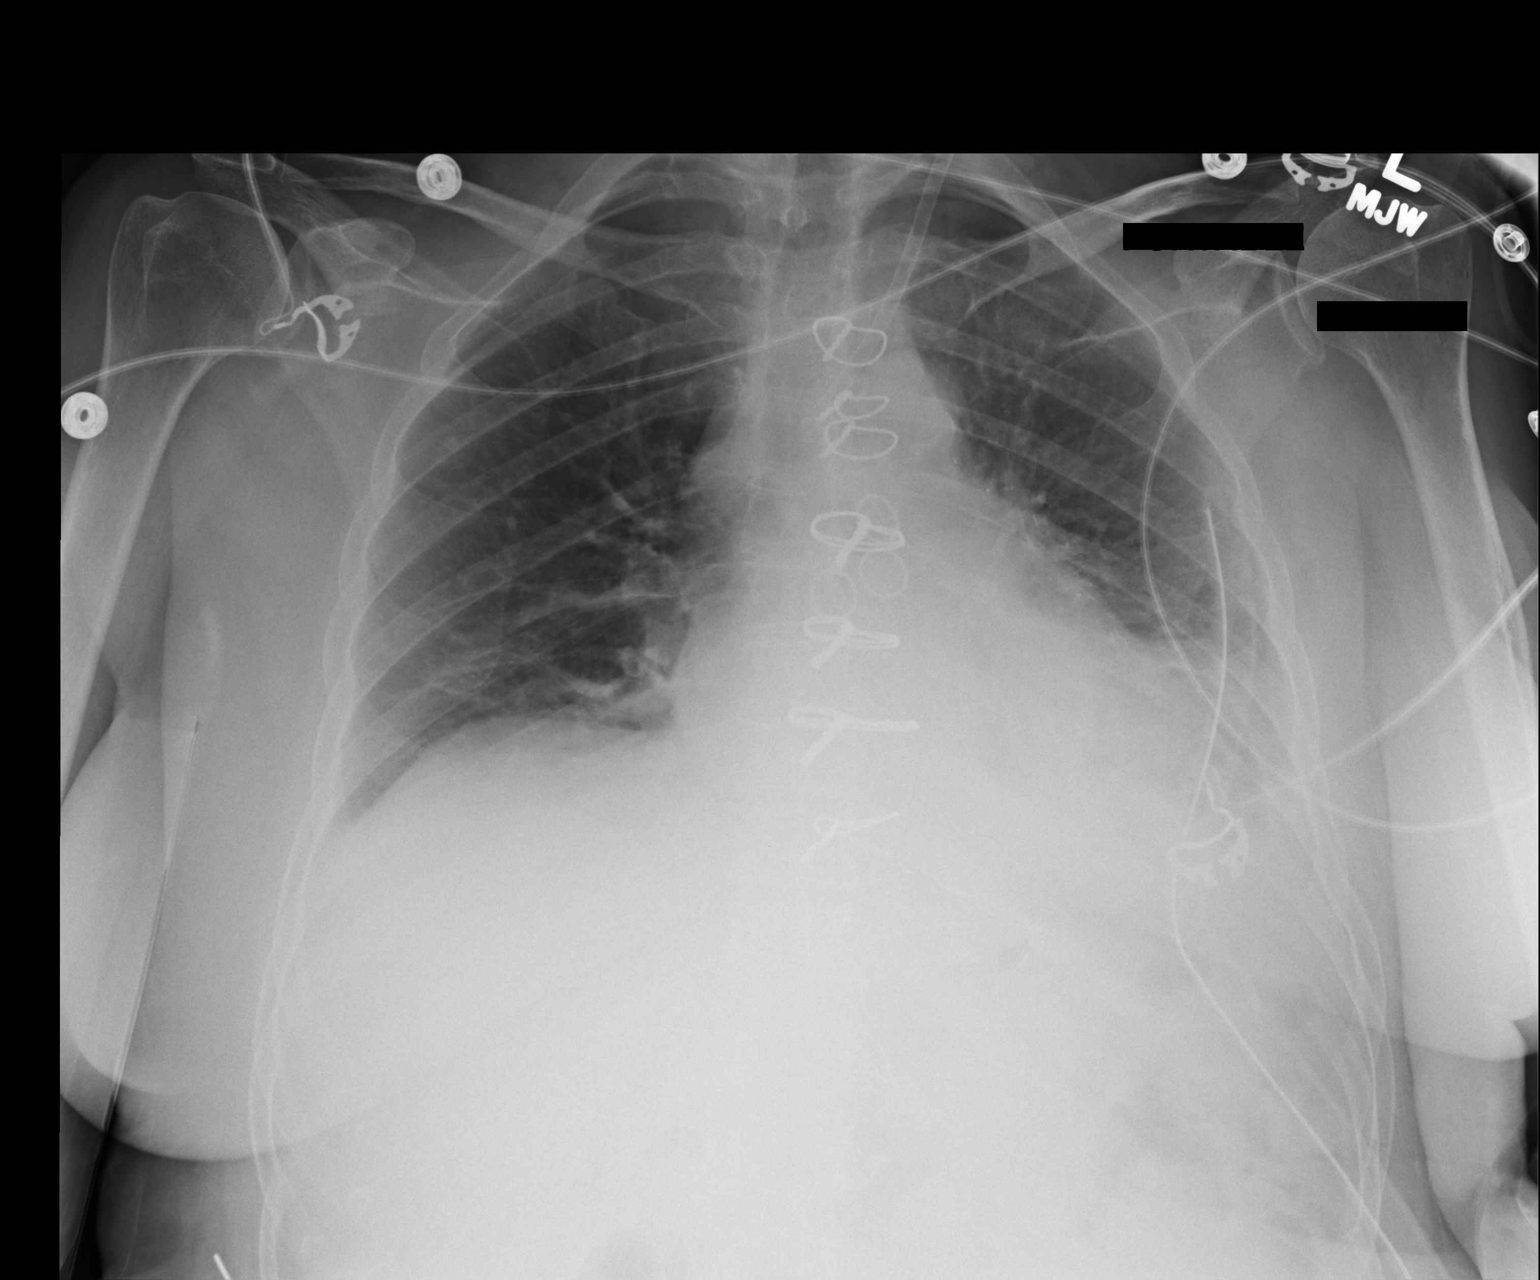

[1 of 1 positions shown; findings below may reference images not displayed]

FINDINGS: The Swan-Ganz catheter has been removed.  The left IJ
vascular sheath remains in unchanged position with the tip
projecting over the left brachiocephalic vein.  Left thoracostomy
tube also in unchanged position. Stable small right, and trace left
pneumothoraces.

Low inspiratory volumes with increased bibasilar opacities favored
to reflect atelectasis.  Status post median sternotomy with
evidence of multivessel CABG.
IMPRESSION: 1.  Interval removal of Swan-Ganz catheter, otherwise stable and
satisfactory support apparatus.
2.  Stable small right and trace left biapical pneumothoraces.
3.  Lower inspiratory volumes with increasing left greater than
right bibasilar atelectasis

## 2014-04-16 NOTE — Progress Notes (Signed)
CALLED PT PER PT   DR  HALL IS  FOLLOWING .Abigail Solis

## 2014-05-17 IMAGING — CR DG CHEST 2V
2 series · 2 of 2 positions shown · non-contrast
Comparison: 11/14/2012

CLINICAL DATA: Atelectasis.  Recent CABG.

CHEST - 2 VIEW

[view not recorded (1 of 2)]
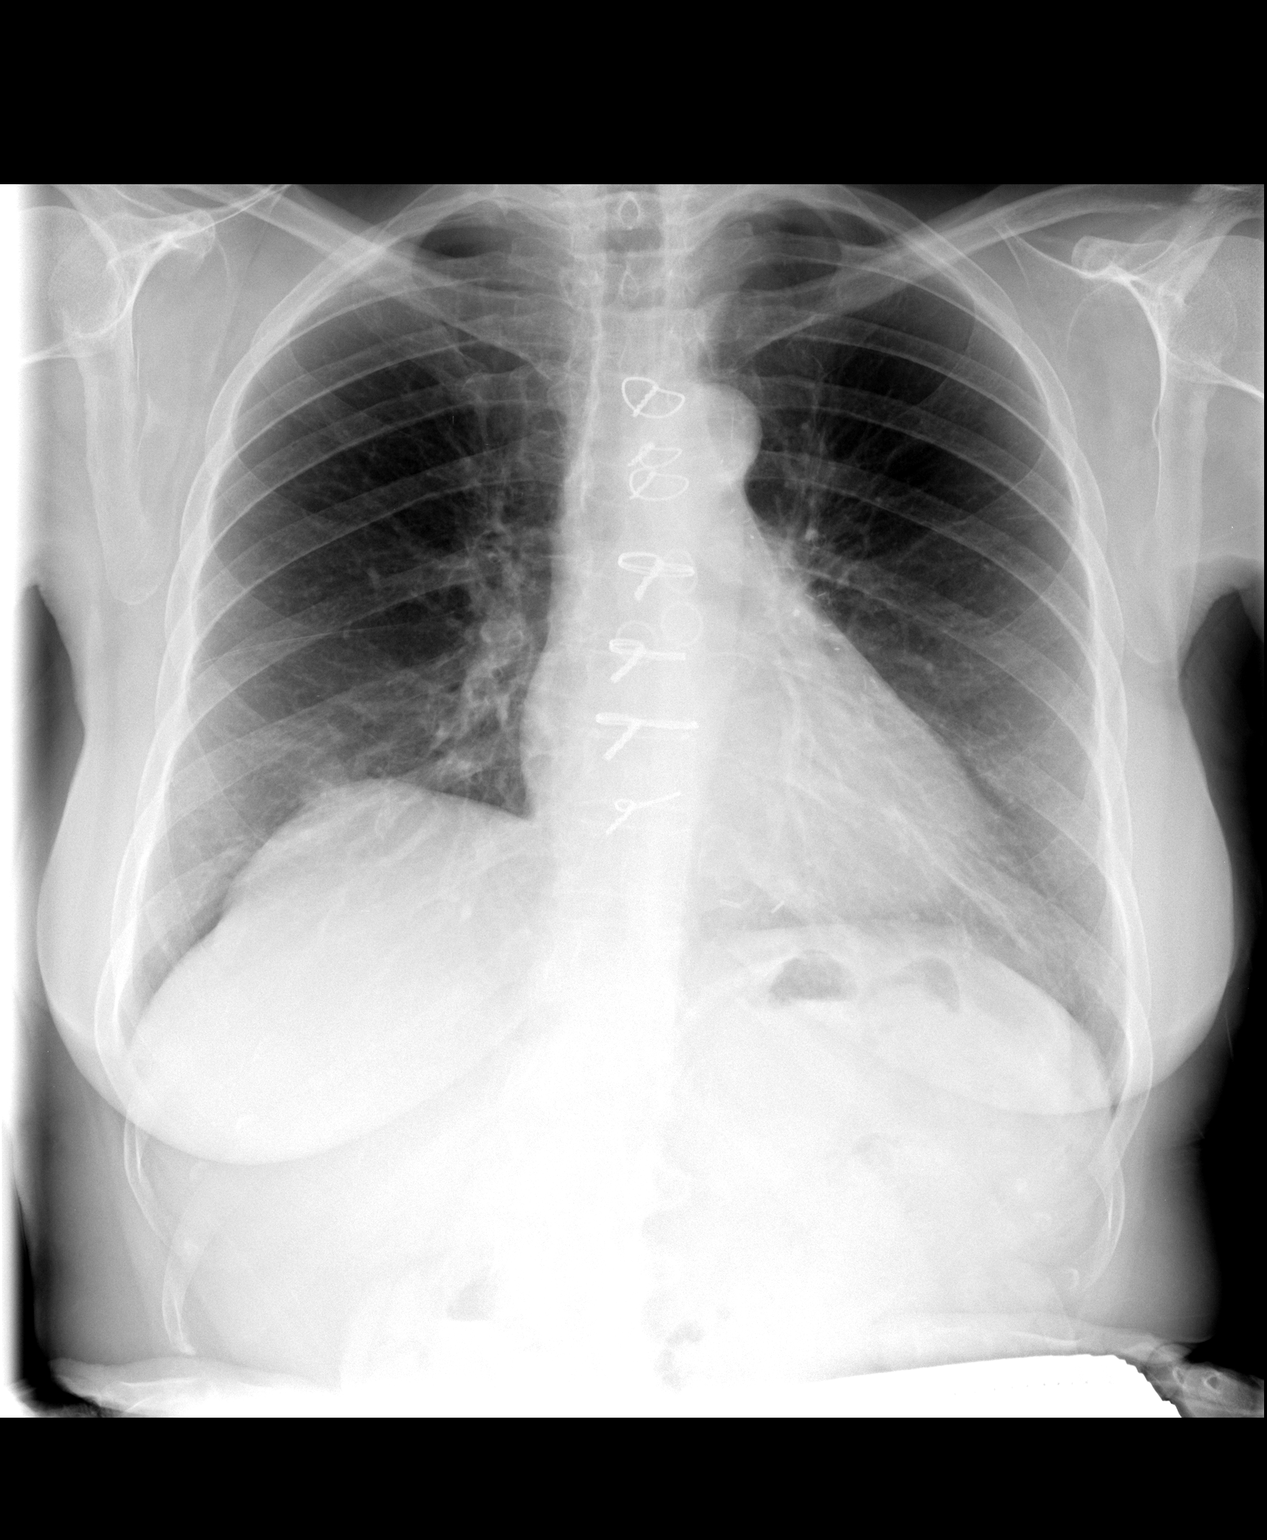

[view not recorded (2 of 2)]
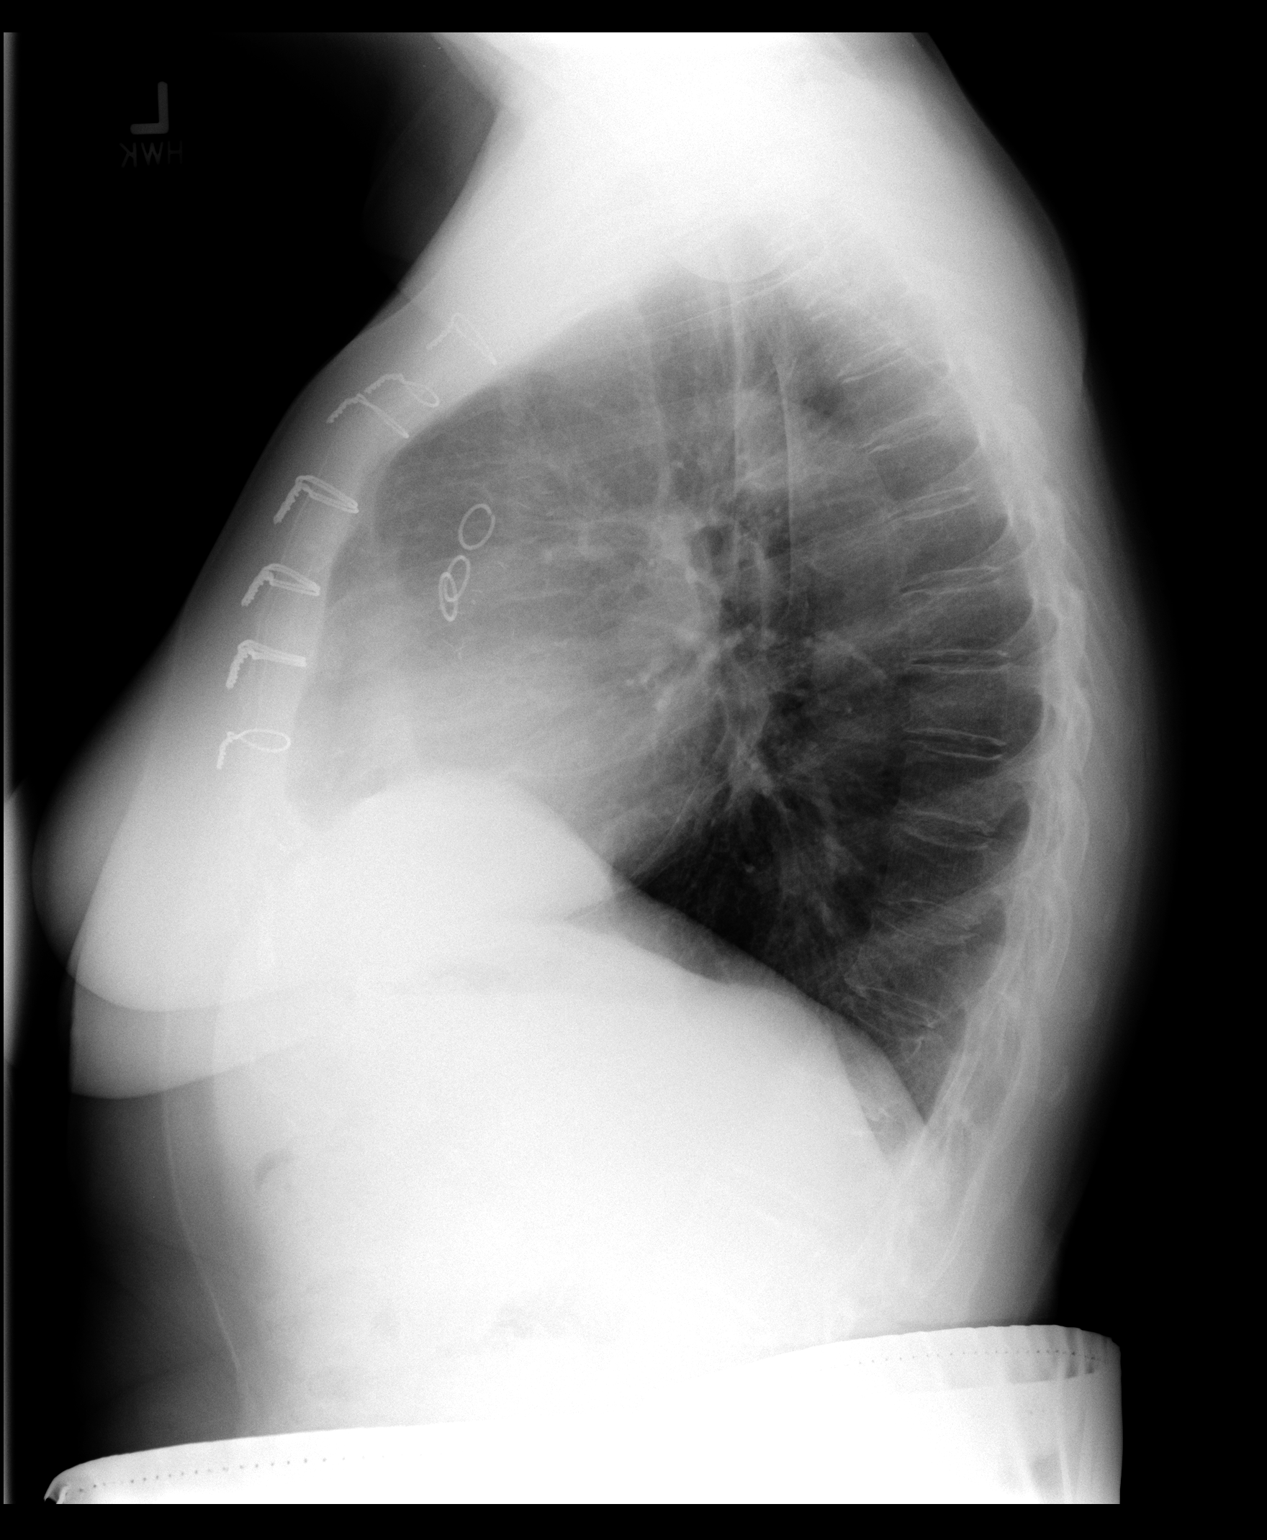

[2 of 2 positions shown; findings below may reference images not displayed]

FINDINGS: Heart size and vascularity are normal.  Atelectasis at
the left base has completely cleared.  No pneumothorax.  Small
nodular density at the right base has almost resolved and is felt
to represent atelectasis. No effusions.
IMPRESSION: Almost complete resolution of the atelectasis.  No acute
abnormalities.

## 2014-09-04 ENCOUNTER — Encounter (HOSPITAL_COMMUNITY): Payer: Self-pay | Admitting: Cardiology

## 2014-09-09 NOTE — Progress Notes (Signed)
Patient ID: Abigail Solis, female   DOB: 07-25-1941, 73 y.o.   MRN: [id number] Abigail Solis is a 73 yo F patient of Dr. Johnsie Cancel. She has a history of CABG in January 2014; otherwise healthy. Family history significant for her father who died at 33 with questionable heart attack. She has tried several statins to treat cholesterol since her event in January. She took Lipitor 40mg  daily from [place] but then stopped due to severe neck and head pain. She was started on Crestor 5mg  every other day in May but stopped in mid-June due to leg pain. She has not ever tried any other medications for cholesterol. All of her current lipid panels are when she has been off medication so unsure what type of results were achieved with Lipitor and Crestor.  Pt lives a healthy life. She does not eat any fried foods. She may have oatmeal or cereal for breakfast, a sandwich for lunch then a grilled meat with vegetables for dinner. She has cut down on her cards since Jan as well. She does like to walk on a daily basis. She was walking 2 miles a day last year but has been able to work herself back up to 1 mile/day at this time. States it takes her about 20-25 minutes to walk the mile.   Subsequently failed crestor 2.5 three days / week Failed fenofibrate as well Both caused headache , fatigue and left leg pain  LDL 128 and HDL 40 08/02/13      ROS: Denies fever, malais, weight loss, blurry vision, decreased visual acuity, cough, sputum, SOB, hemoptysis, pleuritic pain, palpitaitons, heartburn, abdominal pain, melena, lower extremity edema, claudication, or rash.  All other systems reviewed and negative  General: Affect appropriate Healthy:  appears stated age 73: normal Neck supple with no adenopathy JVP normal no bruits no thyromegaly Lungs clear with no wheezing and good diaphragmatic motion Heart:  S1/S2 no murmur, no rub, gallop or click PMI normal Abdomen: benighn, BS positve, no tenderness, no AAA no  bruit.  No HSM or HJR Distal pulses intact with no bruits No edema Neuro non-focal Skin warm and dry No muscular weakness   Current Outpatient Prescriptions  Medication Sig Dispense Refill  . aspirin 81 MG tablet Take 81 mg by mouth daily.     No current facility-administered medications for this visit.    Allergies  Lipitor; Nsaids; and Crestor  Electrocardiogram:  1/14 SR rate 79   Marked anterolateral T wave inversions  Today SR ate 75  normal  Assessment and Plan

## 2014-09-10 ENCOUNTER — Ambulatory Visit (INDEPENDENT_AMBULATORY_CARE_PROVIDER_SITE_OTHER): Payer: Medicare HMO | Admitting: Cardiovascular Disease

## 2014-09-10 ENCOUNTER — Encounter: Payer: Self-pay | Admitting: Cardiovascular Disease

## 2014-09-10 VITALS — BP 134/70 | HR 74 | Ht 60.0 in | Wt 120.0 lb

## 2014-09-10 DIAGNOSIS — E785 Hyperlipidemia, unspecified: Secondary | ICD-10-CM

## 2014-09-10 DIAGNOSIS — Z951 Presence of aortocoronary bypass graft: Secondary | ICD-10-CM

## 2014-09-10 DIAGNOSIS — Z79899 Other long term (current) drug therapy: Secondary | ICD-10-CM

## 2014-09-10 DIAGNOSIS — E782 Mixed hyperlipidemia: Secondary | ICD-10-CM

## 2014-09-10 LAB — LIPID PANEL
CHOL/HDL RATIO: 4
Cholesterol: 196 mg/dL (ref 0–200)
HDL: 44.5 mg/dL (ref >39.00)
LDL CALC: 139 mg/dL — AB (ref 0–99)
NONHDL: 151.5
TRIGLYCERIDES: 65 mg/dL (ref 0.0–149.0)
VLDL: 13 mg/dL (ref 0.0–40.0)

## 2014-09-10 LAB — HEPATIC FUNCTION PANEL
ALT: 17 U/L (ref 0–35)
AST: 24 U/L (ref 0–37)
Albumin: 3.7 g/dL (ref 3.5–5.2)
Alkaline Phosphatase: 63 U/L (ref 39–117)
BILIRUBIN DIRECT: 0.1 mg/dL (ref 0.0–0.3)
Total Bilirubin: 0.6 mg/dL (ref 0.2–1.2)
Total Protein: 9.1 g/dL — ABNORMAL HIGH (ref 6.0–8.3)

## 2014-09-10 NOTE — Assessment & Plan Note (Signed)
Stable with no angina and good activity level.  Continue medical Rx  

## 2014-09-10 NOTE — Assessment & Plan Note (Signed)
Repeat labs today if high may be candidate for PS[facility] 9 drugs

## 2014-09-10 NOTE — Patient Instructions (Signed)
Your physician wants you to follow-up in:  [place] will receive a reminder letter in the mail two months in advance. If you don't receive a letter, please call our office to schedule the follow-up appointment. Your physician recommends that you continue on your current medications as directed. Please refer to the Current Medication list given to you today. Your physician recommends that you return for lab work in:  [place]

## 2014-09-12 ENCOUNTER — Telehealth: Payer: Self-pay | Admitting: Cardiovascular Disease

## 2014-09-12 NOTE — Telephone Encounter (Signed)
Pt is returning a call to christine

## 2014-09-12 NOTE — Telephone Encounter (Signed)
Spoke with pt.  She never started the Crestor 2-3 days a week.  She will try this now and then call in 4-6 weeks to let us know how it is working.  If she fails this, will also need to fail Zetia before qualifying for [facility].

## 2014-09-12 NOTE — Telephone Encounter (Signed)
SEE LAB RESULTS ./CY 

## 2014-09-12 NOTE — Telephone Encounter (Signed)
PT WOULD NOW LIKE TO TALK WITH Abigail Solis

## 2015-03-23 ENCOUNTER — Other Ambulatory Visit: Payer: Self-pay

## 2016-01-27 DIAGNOSIS — R69 Illness, unspecified: Secondary | ICD-10-CM | POA: Diagnosis not present

## 2016-03-16 ENCOUNTER — Encounter: Payer: Self-pay | Admitting: Cardiovascular Disease

## 2016-03-28 NOTE — Progress Notes (Signed)
Patient ID: Abigail Solis, female   DOB: 07-21-1941, 75 y.o.   MRN: [id number] Mrs. Cusick is a  75 y.o.  has a history of CABG in January 2014; otherwise healthy. Family history significant for her father who died at 15 with questionable heart attack. She has tried several statins to treat cholesterol . She took Lipitor 40mg  daily from [facility] [facility] but then stopped due to severe neck and head pain. She was started on Crestor 5mg  every other day in May but stopped in mid-June due to leg pain. She has not ever tried any other medications for cholesterol. All of her current lipid panels are when she has been off medication so unsure what type of results were achieved with Lipitor and Crestor.   Pt lives a healthy life. She does not eat any fried foods. She may have oatmeal or cereal for breakfast, a sandwich for lunch then a grilled meat with vegetables for dinner. She has cut down on her cards since Jan as well. She does like to walk on a daily basis. She was walking 2 miles a day last year but has been able to work herself back up to 1 mile/day at this time. States it takes her about 20-25 minutes to walk the mile.   Subsequently failed crestor 2.5 three days / week Failed fenofibrate as well Both caused headache , fatigue and left leg pain Has not been on zetia and would need to fail this before qualifying for PSK-9  CABG 09/2012  [facility]:  SURGICAL PROCEDURE: Coronary artery bypass grafting x4 with the left internal mammary to the left anterior descending coronary artery, reverse saphenous vein graft to the diagonal coronary artery, reverse saphenous vein graft to the distal circumflex, reverse saphenous vein graft to the distal right coronary artery with bilateral thigh and the vein harvesting.  Lab Results  Component Value Date   LDLCALC 139* 09/10/2014         ROS: Denies fever, malais, weight loss, blurry vision, decreased visual acuity, cough, sputum, SOB, hemoptysis, pleuritic  pain, palpitaitons, heartburn, abdominal pain, melena, lower extremity edema, claudication, or rash.  All other systems reviewed and negative  General: Affect appropriate Healthy:  appears stated age 69: normal Neck supple with no adenopathy JVP normal no bruits no thyromegaly Lungs clear with no wheezing and good diaphragmatic motion Heart:  S1/S2 no murmur, no rub, gallop or click PMI normal Abdomen: benighn, BS positve, no tenderness, no AAA no bruit.  No HSM or HJR Distal pulses intact with no bruits No edema Neuro non-focal Skin warm and dry No muscular weakness   Current Outpatient Prescriptions  Medication Sig Dispense Refill  . aspirin 81 MG tablet Take 81 mg by mouth daily.    Marland Kitchen ezetimibe (ZETIA) 10 MG tablet Take 1 tablet (10 mg total) by mouth daily. 90 tablet 3   No current facility-administered medications for this visit.    Allergies  Lipitor; Nsaids; and Crestor  Electrocardiogram:  1/14 SR rate 79   Marked anterolateral T wave inversions  Today SR ate 75  Normal  03/30/16  SR rate 69  Normal ECG   Assessment and Plan CAD/CABG:  2014 stable no angina continue ASA  Chol: start zetia check labs today f/u lipid clinic intolerant to statins Edema: has venous discoloration feet benign from venule dilatation HTN:  Will check home readings since starting chol pil will watch And consider adding BP med if home readings high in near future  F/U lipid clinic f/u  me 3 months    Jenkins Rouge

## 2016-03-30 ENCOUNTER — Encounter: Payer: Self-pay | Admitting: Cardiovascular Disease

## 2016-03-30 ENCOUNTER — Ambulatory Visit (INDEPENDENT_AMBULATORY_CARE_PROVIDER_SITE_OTHER): Payer: Medicare HMO | Admitting: Cardiovascular Disease

## 2016-03-30 VITALS — BP 150/86 | HR 70 | Ht 60.0 in | Wt 117.8 lb

## 2016-03-30 DIAGNOSIS — Z09 Encounter for follow-up examination after completed treatment for conditions other than malignant neoplasm: Secondary | ICD-10-CM | POA: Diagnosis not present

## 2016-03-30 DIAGNOSIS — E782 Mixed hyperlipidemia: Secondary | ICD-10-CM

## 2016-03-30 DIAGNOSIS — Z79899 Other long term (current) drug therapy: Secondary | ICD-10-CM | POA: Diagnosis not present

## 2016-03-30 DIAGNOSIS — Z951 Presence of aortocoronary bypass graft: Secondary | ICD-10-CM | POA: Diagnosis not present

## 2016-03-30 LAB — HEPATIC FUNCTION PANEL
ALT: 11 U/L (ref 6–29)
AST: 20 U/L (ref 10–35)
Albumin: 3.8 g/dL (ref 3.6–5.1)
Alkaline Phosphatase: 61 U/L (ref 33–130)
BILIRUBIN DIRECT: 0 mg/dL (ref <=0.2)
BILIRUBIN TOTAL: 0.5 mg/dL (ref 0.2–1.2)
Indirect Bilirubin: 0.5 mg/dL (ref 0.2–1.2)
Total Protein: 8.9 g/dL — ABNORMAL HIGH (ref 6.1–8.1)

## 2016-03-30 LAB — LIPID PANEL
Cholesterol: 192 mg/dL (ref 125–200)
HDL: 63 mg/dL
LDL Cholesterol: 116 mg/dL
Total CHOL/HDL Ratio: 3 ratio
Triglycerides: 67 mg/dL
VLDL: 13 mg/dL

## 2016-03-30 MED ORDER — EZETIMIBE 10 MG PO TABS: 10.0000 mg | ORAL_TABLET | Freq: Every day | ORAL | Status: DC

## 2016-03-30 NOTE — Patient Instructions (Addendum)
Medication Instructions:  Your physician has recommended you make the following change in your medication:  1-START Zetia 10 mg by mouth daily  Labwork: Your physician recommends that you have lab work today- Lipid and liver panel   Testing/Procedures: NONE  Follow-Up: Your physician wants you to follow-up in: 3 months with Dr. Johnsie Cancel.   Your physician recommends that you schedule a follow-up appointment in: 2 weeks with Lipid Clinic.   If you need a refill on your cardiac medications before your next appointment, please call your pharmacy.

## 2016-03-31 ENCOUNTER — Other Ambulatory Visit: Payer: Self-pay

## 2016-04-14 ENCOUNTER — Ambulatory Visit (INDEPENDENT_AMBULATORY_CARE_PROVIDER_SITE_OTHER): Payer: Medicare HMO | Admitting: Pharmacist

## 2016-04-14 DIAGNOSIS — E782 Mixed hyperlipidemia: Secondary | ICD-10-CM | POA: Diagnosis not present

## 2016-04-14 MED ORDER — ROSUVASTATIN CALCIUM 5 MG PO TABS: ORAL_TABLET | ORAL | Status: DC

## 2016-04-14 NOTE — Progress Notes (Signed)
Patient ID: Abigail Solis                 DOB: [birth year]                    MRN: [id number]     HPI: Abigail Solis is a 75 y.o. female patient referred to lipid clinic by Dr. Johnsie Cancel. PMH is significant for NSTEMI that required CABG x4 in January 2014. She has a history of statin intolerance with Crestor, Lipitor, and pravastatin. She was prescribed Zetia 2 weeks ago however reports that she never picked it up because she wanted to discuss the medication in more detail.  She reports joint aches in her legs and neck with Crestor 5mg  daily, Lipitor 40mg  daily, and pravastatin unknown dose. She was previously seen in lipid clinic 3 years ago; at that time, Crestor 5mg  once weekly was recommended. Pt does not recall if she tried this dosing or not, but thinks she only tried taking Crestor daily. She does not like to take medications and reports that she has always been sensitive to them.  Current Medications: none Intolerances: atorvastatin 40mg  - severe neck and head pain, rosuvastatin 5mg  daily, pravastatin ? Dose - joint pain Risk Factors: NSTEMI, CABG, CAD, age LDL goal: < 70mg /dL  Diet: Avoids fried food. Eats cereal or oatmeal for breakfast, sandwich for lunch, grilled meat and vegetables for dinner.   Exercise: Walks on a daily basis - 1 mile a day - working to increase this to 2 miles a day.  Family History: Father died at 48 with questionable heart attack.  Social History: Patient denies cigarette, alcohol, or illicit drug use.  Labs: 03/30/16: TC 192, TG 67, HDL 63, LDL 116 (no therapy) 09/10/14: TC 196, TG 65, HDL 44.5, LDL 139 (no therapy)  Past Medical History  Diagnosis Date  . Reflux   . Chest pain 10/03/2012  . GERD (gastroesophageal reflux disease)     Current Outpatient Prescriptions on File Prior to Visit  Medication Sig Dispense Refill  . aspirin 81 MG tablet Take 81 mg by mouth daily.    Marland Kitchen ezetimibe (ZETIA) 10 MG tablet Take 1 tablet (10 mg total) by mouth  daily. 90 tablet 3   No current facility-administered medications on file prior to visit.    Allergies  Allergen Reactions  . Lipitor [Atorvastatin] Other (See Comments)    HEADACHED  . Nsaids Hives and Itching    blisters  . Crestor [Rosuvastatin] Other (See Comments)    myalgias    Assessment/Plan:  1. Hyperlipidemia - LDL 116mg /dL above goal < 70mg /dL given history of NSTEMI and CABG. Previous intolerances to Lipitor 40mg  daily, Crestor 5mg  daily, and pravastatin. Will try pt on Crestor 5mg  TIW with self-titration instructions depending on tolerability.  If she is intolerant to Crestor dosed a few days a week, would revisit Zetia as an option. Discussed PCSK9i briefly however pt has Medicare and monthly copays would be cost prohibitive for patient.   Denny Lave E. Ryliegh Mcduffey, PharmD, [place] [id number], [place], [place] [address] Phone: [phone]; Fax: [phone] 04/14/2016 11:42 AM

## 2016-04-14 NOTE — Patient Instructions (Addendum)
Pick up your prescription for Crestor (rosuvastatin) 5mg . Start by taking it 3 days a week. You can increase or decrease the frequency of your dose depending on tolerability.  Follow up with Dr. Johnsie Cancel in 3 months as advised - he can check your cholesterol at that time.  Call Artemis Loyal in lipid clinic with any questions [phone].

## 2016-10-19 NOTE — Progress Notes (Signed)
Patient ID: Abigail Solis, female   DOB: 01/21/1941, 76 y.o.   MRN: [id number] Mrs. Hagaman is a  76 y.o.  has a history of CABG in January 2014; otherwise healthy. Family history significant for her father who died at 56 with questionable heart attack. She has tried several statins to treat cholesterol . She took Lipitor 40mg  daily from [facility] but then stopped due to severe neck and head pain. She was started on Crestor 5mg  every other day in May but stopped in mid-June due to leg pain. She has not ever tried any other medications for cholesterol. All of her current lipid panels are when she has been off medication so unsure what type of results were achieved with Lipitor and Crestor.   Pt lives a healthy life. She does not eat any fried foods. She may have oatmeal or cereal for breakfast, a sandwich for lunch then a grilled meat with vegetables for dinner. She has cut down on her cards since Jan as well. She does like to walk on a daily basis. She was walking 2 miles a day last year but has been able to work herself back up to 1 mile/day at this time. States it takes her about 20-25 minutes to walk the mile.   Failed fenofibrate as well caused headache , fatigue and left leg pain 04/14/16 Seen by lipid clinic tried on low dose crestor 3x/week   CABG 09/2012  [facility]: 2014   SURGICAL PROCEDURE: Coronary artery bypass grafting x4 with the left internal mammary to the left anterior descending coronary artery, reverse saphenous vein graft to the diagonal coronary artery, reverse saphenous vein graft to the distal circumflex, reverse saphenous vein graft to the distal right coronary artery with bilateral thigh and the vein harvesting.  Lab Results  Component Value Date   LDLCALC 116 03/30/2016    ROS: Denies fever, malais, weight loss, blurry vision, decreased visual acuity, cough, sputum, SOB, hemoptysis, pleuritic pain, palpitaitons, heartburn, abdominal pain, melena, lower extremity edema,  claudication, or rash.  All other systems reviewed and negative  General: Affect appropriate Healthy:  appears stated age 55: normal Neck supple with no adenopathy JVP normal no bruits no thyromegaly Lungs clear with no wheezing and good diaphragmatic motion Heart:  S1/S2 no murmur, no rub, gallop or click PMI normal Abdomen: benighn, BS positve, no tenderness, no AAA no bruit.  No HSM or HJR Distal pulses intact with no bruits No edema Neuro non-focal Skin warm and dry No muscular weakness   Current Outpatient Prescriptions  Medication Sig Dispense Refill  . aspirin 81 MG tablet Take 81 mg by mouth daily.     No current facility-administered medications for this visit.     Allergies  Lipitor [atorvastatin]; Nsaids; and Crestor [rosuvastatin]  Electrocardiogram:  1/14 SR rate 79   Marked anterolateral T wave inversions  Today SR ate 75  Normal  03/30/16  SR rate 69  Normal ECG   Assessment and Plan  CAD/CABG:  2014 stable no angina continue ASA   Chol: seen by lipid clinic tried low dose crestor 3 days/week  PSK 9 on medicare cost prohibitive  Edema: has venous discoloration feet benign from venule dilatation  HTN:  Will check home readings  will watch And consider adding BP med if home readings high in near future  Her care is limited by her fear of taking any medications   Jenkins Rouge

## 2016-10-27 ENCOUNTER — Encounter: Payer: Self-pay | Admitting: Cardiovascular Disease

## 2016-10-27 ENCOUNTER — Ambulatory Visit (INDEPENDENT_AMBULATORY_CARE_PROVIDER_SITE_OTHER): Payer: Medicare HMO | Admitting: Cardiovascular Disease

## 2016-10-27 VITALS — BP 140/90 | HR 71 | Ht 60.0 in | Wt 115.8 lb

## 2016-10-27 DIAGNOSIS — Z79899 Other long term (current) drug therapy: Secondary | ICD-10-CM

## 2016-10-27 NOTE — Patient Instructions (Addendum)
Medication Instructions:  Your physician recommends that you continue on your current medications as directed. Please refer to the Current Medication list given to you today.  Labwork: NONE  Testing/Procedures: NONE  Follow-Up: Your physician wants you to follow-up in: 6 months with Dr. Johnsie Cancel. You will receive a reminder letter in the mail two months in advance. If you don't receive a letter, please call our office to schedule the follow-up appointment.  Your physician recommends that you schedule a follow-up appointment with lipid clinic.  Keep record of your Blood Pressure for a couple of weeks and give our office a call with the results.    If you need a refill on your cardiac medications before your next appointment, please call your pharmacy.   Repatha is the medication Dr. Johnsie Cancel was referring to at your visit today.

## 2016-11-16 ENCOUNTER — Telehealth: Payer: Self-pay | Admitting: *Deleted

## 2016-11-16 NOTE — Telephone Encounter (Signed)
opened in error

## 2016-11-17 NOTE — Progress Notes (Signed)
Patient ID: Abigail Solis                 DOB: 1941-09-26                    MRN: [id number]     HPI: Abigail Solis is a 76 y.o. female patient referred to lipid clinic by Dr. Johnsie Cancel. PMH is significant for NSTEMI that required CABG x4 in January 2014. She has a history of statin intolerance with Crestor, Lipitor, and pravastatin. Pt does not like to take medications and reports she has always been sensitive to them.   She reports joint aches in her legs and neck with Crestor 5mg  daily, Lipitor 40mg  daily, and pravastatin unknown dose. She was previously seen in lipid clinic 3 years ago; at that time, Crestor 5mg  once weekly was recommended. Pt was previously prescribed Zetia 10mg  daily by Dr Johnsie Cancel but never picked up her prescription. At last lipid visit in June 2017, pt was advised to start taking Crestor 5mg  3x per week and self-titrate her dose. Similarly, she never picked up her prescription. Her care is limited by fear of taking any medications.  Current Medications: none Intolerances: atorvastatin 40mg  - severe neck and head pain, rosuvastatin 5mg  daily and every other day, pravastatin ? Dose - joint pain, fenofibrate - headache, fatigue, left leg pain Risk Factors: NSTEMI, CABG x4 vessels, CAD, age LDL goal: < 70mg /dL  Diet: Avoids fried food. Eats cereal or oatmeal for breakfast, sandwich for lunch, grilled meat and vegetables for dinner.   Exercise: Walks on a daily basis - 1 mile a day - working to increase this to 2 miles a day.  Family History: Father died at 89 with questionable heart attack.  Social History: Patient denies cigarette, alcohol, or illicit drug use.  Labs: 03/30/16: TC 192, TG 67, HDL 63, LDL 116 (no therapy) 09/10/14: TC 196, TG 65, HDL 44.5, LDL 139 (no therapy)  Past Medical History:  Diagnosis Date  . Chest pain 10/03/2012  . GERD (gastroesophageal reflux disease)   . Reflux     Current Outpatient Prescriptions on File Prior to Visit    Medication Sig Dispense Refill  . aspirin 81 MG tablet Take 81 mg by mouth daily.     No current facility-administered medications on file prior to visit.     Allergies  Allergen Reactions  . Lipitor [Atorvastatin] Other (See Comments)    HEADACHED  . Nsaids Hives and Itching    blisters  . Crestor [Rosuvastatin] Other (See Comments)    myalgias    Assessment/Plan:  1. Hyperlipidemia - LDL 116 above goal 70mg /dL given hx of ASCVD. Pt is very resistant to taking any medications in general and is currently only on a baby aspirin despite previous MI. Discussed benefits of lowering cholesterol and statin therapy at length. Last 2 times prescriptions have been sent in for Crestor and Zetia, pt never picked them up. She seems slightly more willing to try a medication after much discussion today. Will send in another rx for Crestor 5mg  3x per week. Pt will call with update on tolerability. Will order lab work at future date once pt adherence is confirmed.   Kayona Foor E. Jaylon Boylen, PharmD, CPP, [place] [id number], [place], [place] [address] Phone: [phone]; Fax: [phone] 11/18/2016 11:18 AM

## 2016-11-18 ENCOUNTER — Ambulatory Visit (INDEPENDENT_AMBULATORY_CARE_PROVIDER_SITE_OTHER): Payer: Medicare HMO | Admitting: Pharmacist

## 2016-11-18 DIAGNOSIS — E782 Mixed hyperlipidemia: Secondary | ICD-10-CM | POA: Diagnosis not present

## 2016-11-18 MED ORDER — ROSUVASTATIN CALCIUM 5 MG PO TABS: ORAL_TABLET | ORAL | 11 refills | Status: DC

## 2016-11-18 NOTE — Patient Instructions (Signed)
Start taking Crestor (rosuvastatin) 5mg  3 days a week as tolerated  Call Megan in lipid clinic with updates on tolerability [phone]

## 2016-11-28 ENCOUNTER — Telehealth: Payer: Self-pay | Admitting: Pharmacist

## 2016-11-28 NOTE — Telephone Encounter (Signed)
Pt walked in today requesting lipids to be checked. Per last OV she was to start on Crestor 5mg  TIW about 2 weeks ago. She reports today that she has not yet started on this. Last lipid panel in 03/2016 she was not on statin at that time and has made no change with diet/exercise. Indicated that there was no reason to believe her labs would be different, but we will check a panel in 2-3 months as she plans to start Crestor soon. She will call the office once she has started and to report any symptoms if they develop. She states understanding and appreciation.

## 2017-01-25 DIAGNOSIS — R69 Illness, unspecified: Secondary | ICD-10-CM | POA: Diagnosis not present

## 2017-02-23 ENCOUNTER — Ambulatory Visit (INDEPENDENT_AMBULATORY_CARE_PROVIDER_SITE_OTHER): Payer: Medicare HMO | Admitting: Otolaryngology

## 2017-03-07 ENCOUNTER — Telehealth: Payer: Self-pay | Admitting: Cardiovascular Disease

## 2017-03-07 ENCOUNTER — Telehealth: Payer: Self-pay | Admitting: Physician Assistant

## 2017-03-07 NOTE — Telephone Encounter (Signed)
Paged by answering service. Per son Wendi Snipes, patient having anxiety attack and not eating for past 2-3 weeks. The patient denies nausea, vomiting, fever, chest pain, palpitations, shortness of breath, orthopnea, PND, dizziness, syncope, cough, congestion, abdominal pain, hematochezia, melena, lower extremity edema.  She has xanax prescription. Advised to try it and follow up with PCP tomorrow. If symptoms worsen overnight, recommended ER/urgent care follow up. Son aggress with plan and appreciate calling.

## 2017-03-07 NOTE — Telephone Encounter (Signed)
Left message for patient to call back  

## 2017-03-07 NOTE — Telephone Encounter (Signed)
New Message:    Berneta Sages says he need a prescription for pt's nerves. She had an anxiety attack, she need this asap. Please call today if possible.

## 2017-03-20 ENCOUNTER — Ambulatory Visit (INDEPENDENT_AMBULATORY_CARE_PROVIDER_SITE_OTHER): Payer: Medicare HMO | Admitting: Otolaryngology

## 2017-04-20 ENCOUNTER — Ambulatory Visit (INDEPENDENT_AMBULATORY_CARE_PROVIDER_SITE_OTHER): Payer: Medicare HMO | Admitting: Otolaryngology

## 2017-05-04 DIAGNOSIS — E46 Unspecified protein-calorie malnutrition: Secondary | ICD-10-CM | POA: Diagnosis not present

## 2017-05-04 DIAGNOSIS — R69 Illness, unspecified: Secondary | ICD-10-CM | POA: Diagnosis not present

## 2017-05-04 DIAGNOSIS — I2510 Atherosclerotic heart disease of native coronary artery without angina pectoris: Secondary | ICD-10-CM | POA: Diagnosis not present

## 2017-05-04 DIAGNOSIS — Z6820 Body mass index (BMI) 20.0-20.9, adult: Secondary | ICD-10-CM | POA: Diagnosis not present

## 2017-05-13 DIAGNOSIS — R69 Illness, unspecified: Secondary | ICD-10-CM | POA: Diagnosis not present

## 2017-05-13 DIAGNOSIS — I25810 Atherosclerosis of coronary artery bypass graft(s) without angina pectoris: Secondary | ICD-10-CM | POA: Diagnosis not present

## 2017-06-07 DIAGNOSIS — R69 Illness, unspecified: Secondary | ICD-10-CM | POA: Diagnosis not present

## 2017-06-07 DIAGNOSIS — Z6821 Body mass index (BMI) 21.0-21.9, adult: Secondary | ICD-10-CM | POA: Diagnosis not present

## 2017-08-02 DIAGNOSIS — Z6821 Body mass index (BMI) 21.0-21.9, adult: Secondary | ICD-10-CM | POA: Diagnosis not present

## 2017-08-02 DIAGNOSIS — R69 Illness, unspecified: Secondary | ICD-10-CM | POA: Diagnosis not present

## 2017-09-06 DIAGNOSIS — Z6822 Body mass index (BMI) 22.0-22.9, adult: Secondary | ICD-10-CM | POA: Diagnosis not present

## 2017-09-06 DIAGNOSIS — R69 Illness, unspecified: Secondary | ICD-10-CM | POA: Diagnosis not present

## 2017-09-06 DIAGNOSIS — Z5181 Encounter for therapeutic drug level monitoring: Secondary | ICD-10-CM | POA: Diagnosis not present

## 2017-12-19 DIAGNOSIS — Z5181 Encounter for therapeutic drug level monitoring: Secondary | ICD-10-CM | POA: Diagnosis not present

## 2017-12-19 DIAGNOSIS — R69 Illness, unspecified: Secondary | ICD-10-CM | POA: Diagnosis not present

## 2017-12-19 DIAGNOSIS — Z6821 Body mass index (BMI) 21.0-21.9, adult: Secondary | ICD-10-CM | POA: Diagnosis not present

## 2018-01-03 DIAGNOSIS — R69 Illness, unspecified: Secondary | ICD-10-CM | POA: Diagnosis not present

## 2018-01-03 DIAGNOSIS — Z6821 Body mass index (BMI) 21.0-21.9, adult: Secondary | ICD-10-CM | POA: Diagnosis not present

## 2018-02-14 NOTE — Progress Notes (Signed)
Patient ID: Abigail Solis, female   DOB: [birth year], 77 y.o.   MRN: [id number]   77 y.o. with CABG in 2014 Dr Servando Snare This included LIMA to LAD, SVG to D1, SVG to distal circumflex and SVG to distal RCA. EF has been normal. Has significant anxiety issues and does not like to take meds Sometimes just ASA. Has been intolerant to multiple statins and not filled scripts for crestor in past.    She does have healthy diet and walks a lot with no angina   Husband is depressed with failing health  She has always been a worrier and petite with low weight   ROS: Denies fever, malais, weight loss, blurry vision, decreased visual acuity, cough, sputum, SOB, hemoptysis, pleuritic pain, palpitaitons, heartburn, abdominal pain, melena, lower extremity edema, claudication, or rash.  All other systems reviewed and negative  General: BP 120/84   Pulse 73   Ht 5' (1.524 m)   Wt 104 lb (47.2 kg)   SpO2 98%   BMI 20.31 kg/m  Affect appropriate Healthy:  appears stated age 77: normal Neck supple with no adenopathy JVP normal no bruits no thyromegaly Lungs clear with no wheezing and good diaphragmatic motion Heart:  S1/S2 no murmur, no rub, gallop or click PMI normal Abdomen: benighn, BS positve, no tenderness, no AAA no bruit.  No HSM or HJR Distal pulses intact with no bruits Trace edema with chronic venous stasis  Neuro non-focal Skin warm and dry No muscular weakness    Current Outpatient Medications  Medication Sig Dispense Refill  . aspirin 81 MG tablet Take 81 mg by mouth daily.     No current facility-administered medications for this visit.     Allergies  Lipitor [atorvastatin]; Nsaids; and Crestor [rosuvastatin]  Electrocardiogram:  1/14 SR rate 79   Marked anterolateral T wave inversions  Today SR ate 75  Normal  03/30/16  SR rate 69  Normal ECG   Assessment and Plan  CAD/CABG:  2014 stable no angina continue ASA   Chol: seen by lipid clinic tried low dose crestor 3  days/week  PSK 9 on medicare cost prohibitive patient Has not been compliant and not filled prescription for crestor in past   Edema: has venous discoloration feet benign from venule dilatation  HTN:  Well controlled.  Continue current medications and low sodium Dash type diet.    Her care is limited by her fear of taking any medications and anxiety   Abigail Solis

## 2018-02-15 ENCOUNTER — Ambulatory Visit: Payer: Medicare HMO | Admitting: Cardiovascular Disease

## 2018-02-15 ENCOUNTER — Encounter: Payer: Self-pay | Admitting: Cardiovascular Disease

## 2018-02-15 VITALS — BP 120/84 | HR 73 | Ht 60.0 in | Wt 104.0 lb

## 2018-02-15 DIAGNOSIS — Z951 Presence of aortocoronary bypass graft: Secondary | ICD-10-CM | POA: Diagnosis not present

## 2018-02-15 NOTE — Patient Instructions (Signed)

## 2018-04-26 ENCOUNTER — Ambulatory Visit: Payer: Medicare HMO | Admitting: Cardiovascular Disease

## 2018-05-22 ENCOUNTER — Telehealth: Payer: Self-pay | Admitting: Cardiovascular Disease

## 2018-05-22 NOTE — Telephone Encounter (Signed)
Called patient back about her message. Patient complaining about her ASA causing her to have a headache a couple of nights ago. Patient stated she stopped her ASA for one night and she did not have the head ache. Informed patient to try taking ASA in the morning instead of at night with food and see if she gets a headache. Patient will try this and call back if her headache returns.

## 2018-05-22 NOTE — Telephone Encounter (Signed)
New Message    Pt c/o medication issue:  1. Name of Medication: aspirin 81 MG tablet  2. How are you currently taking this medication (dosage and times per day)? Take 81 mg by mouth daily.  3. Are you having a reaction (difficulty breathing--STAT)?   4. What is your medication issue? Patient is calling about aspirin. She states that its not agreeing with her now. Please call to discuss.

## 2018-09-10 DIAGNOSIS — H524 Presbyopia: Secondary | ICD-10-CM | POA: Diagnosis not present

## 2018-09-15 DIAGNOSIS — Z0100 Encounter for examination of eyes and vision without abnormal findings: Secondary | ICD-10-CM | POA: Diagnosis not present

## 2021-04-14 ENCOUNTER — Encounter: Payer: Self-pay | Admitting: Emergency Medicine

## 2021-04-14 ENCOUNTER — Ambulatory Visit
Admission: EM | Admit: 2021-04-14 | Discharge: 2021-04-14 | Disposition: A | Discharge status: Home / Self Care | Payer: PPO | Attending: Family Medicine | Admitting: Family Medicine

## 2021-04-14 ENCOUNTER — Other Ambulatory Visit: Payer: Self-pay

## 2021-04-14 DIAGNOSIS — I10 Essential (primary) hypertension: Secondary | ICD-10-CM

## 2021-04-14 MED ORDER — LISINOPRIL 10 MG PO TABS: 10.0000 mg | ORAL_TABLET | Freq: Every day | ORAL | 1 refills | Status: DC

## 2021-04-14 NOTE — ED Provider Notes (Signed)
[facility]   [id number] 04/14/21 Arrival Time: 9417  ASSESSMENT & PLAN:  1. Essential hypertension    She would like to initiate treatment for HTN. Begin: Meds ordered this encounter  Medications   lisinopril (ZESTRIL) 10 MG tablet    Sig: Take 1 tablet (10 mg total) by mouth daily.    Dispense:  30 tablet    Refill:  1   Pending: Labs Reviewed  BASIC METABOLIC PANEL   Recommend:  Follow-up Information     Schedule an appointment as soon as possible for a visit  with Celene Squibb, MD.   Specialty: Internal Medicine Contact information: [place] [address] [phone]                 Reviewed expectations re: course of current medical issues. Questions answered. Outlined signs and symptoms indicating need for more acute intervention. Patient verbalized understanding. After Visit Summary given.   SUBJECTIVE:  Abigail Solis is a 80 y.o. female with h/o NSTEMI, CABG, CHF who presents with concerns regarding increased blood pressures. Reports that she has been treated for hypertension in the distant past. No symptoms. Has been checking pressures over past two months with persistent readings above normal.  She reports no chest pain on exertion, no dyspnea on exertion, no swelling of ankles, no orthostatic dizziness or lightheadedness, no orthopnea or paroxysmal nocturnal dyspnea, and no palpitations.  Denies symptoms of chest pain, palpations, orthopnea, nocturnal dyspnea, or LE edema.  Social History   Tobacco Use  Smoking Status Never  Smokeless Tobacco Never     ROS: As per HPI.   OBJECTIVE:  Vitals:   04/14/21 1246  BP: (!) 171/77  Pulse: 80  Resp: 16  Temp: 97.9 F (36.6 C)  TempSrc: Oral  SpO2: 98%    General appearance: alert; no distress Eyes: PERRLA; EOMI HENT: normocephalic; atraumatic Neck: supple Lungs: unlabored Extremities: no edema; symmetrical with no gross deformities Skin: warm and  dry Psychological: alert and cooperative; normal mood and affect  Allergies  Allergen Reactions   Lipitor [Atorvastatin] Other (See Comments)    HEADACHED   Nsaids Hives and Itching    blisters   Crestor [Rosuvastatin] Other (See Comments)    myalgias    Past Medical History:  Diagnosis Date   Chest pain 10/03/2012   GERD (gastroesophageal reflux disease)    Reflux    Social History   Socioeconomic History   Marital status: Married    Spouse name: Not on file   Number of children: 3   Years of education: Not on file   Highest education level: Not on file  Occupational History   Not on file  Tobacco Use   Smoking status: Never   Smokeless tobacco: Never  Vaping Use   Vaping Use: Never used  Substance and Sexual Activity   Alcohol use: No   Drug use: No   Sexual activity: Not on file  Other Topics Concern   Not on file  Social History Narrative   Lives at home with husband   Social Determinants of Health   Financial Resource Strain: Not on file  Food Insecurity: Not on file  Transportation Needs: Not on file  Physical Activity: Not on file  Stress: Not on file  Social Connections: Not on file  Intimate Partner Violence: Not on file   Family History  Problem Relation Age of Onset   Sudden death Father 50   Past Surgical History:  Procedure Laterality Date   CORONARY ARTERY BYPASS GRAFT  10/09/2012   Procedure: CORONARY ARTERY BYPASS GRAFTING (CABG);  Surgeon: Grace Isaac, MD;  Location: [facility];  Service: Open Heart Surgery;  Laterality: N/A;  times four using left internal artery and bilateral saphenous vein endoscopically harvested   INTRAOPERATIVE TRANSESOPHAGEAL ECHOCARDIOGRAM  10/09/2012   Procedure: INTRAOPERATIVE TRANSESOPHAGEAL ECHOCARDIOGRAM;  Surgeon: Grace Isaac, MD;  Location: [facility];  Service: Open Heart Surgery;  Laterality: N/A;   LEFT HEART CATHETERIZATION WITH CORONARY ANGIOGRAM N/A 10/04/2012   Procedure: LEFT HEART CATHETERIZATION  WITH CORONARY ANGIOGRAM;  Surgeon: [name] Martinique, MD;  Location: [facility] CATH LAB;  Service: Cardiovascular;  Laterality: N/A;   NO PAST SURGERIES     None         Vanessa Kick, MD 04/14/21 1326

## 2021-04-14 NOTE — Discharge Instructions (Addendum)
You have had labs (blood work) drawn today. We will call you with any significant abnormalities or if there is need to begin or change treatment or pursue further follow up.  You may also review your test results online through MyChart. If you do not have a MyChart account, instructions to sign up should be on your discharge paperwork.  

## 2021-04-14 NOTE — ED Triage Notes (Signed)
Has been checking blood pressures at home and they have been elevated over the past month.

## 2021-04-15 ENCOUNTER — Telehealth: Payer: Self-pay | Admitting: Emergency Medicine

## 2021-04-15 LAB — BASIC METABOLIC PANEL
BUN/Creatinine Ratio: 21 (ref 12–28)
BUN: 15 mg/dL (ref 8–27)
CO2: 21 mmol/L (ref 20–29)
Calcium: 9.4 mg/dL (ref 8.7–10.3)
Chloride: 106 mmol/L (ref 96–106)
Creatinine, Ser: 0.71 mg/dL (ref 0.57–1.00)
Glucose: 91 mg/dL (ref 65–99)
Potassium: 4.8 mmol/L (ref 3.5–5.2)
Sodium: 140 mmol/L (ref 134–144)
eGFR: 86 mL/min/{1.73_m2} (ref >59)

## 2021-04-15 MED ORDER — LISINOPRIL 5 MG PO TABS: 5.0000 mg | ORAL_TABLET | Freq: Every day | ORAL | 0 refills | Status: DC

## 2021-04-15 NOTE — Telephone Encounter (Signed)
Patient reports fatigue, dizziness with starting lisinopril.  Would like to try lower dose.  Has appt with PCP coming up.

## 2021-07-07 DIAGNOSIS — D1801 Hemangioma of skin and subcutaneous tissue: Secondary | ICD-10-CM | POA: Diagnosis not present

## 2021-07-07 DIAGNOSIS — L821 Other seborrheic keratosis: Secondary | ICD-10-CM | POA: Diagnosis not present

## 2021-11-24 NOTE — Progress Notes (Deleted)
CARDIOLOGY CONSULT NOTE  ? ? ? ? ? ?Patient ID: ?Abigail Solis ?MRN: [id number] ?DOB/AGE: September 12, 1941 81 y.o. ? ?Admit date: (Not on file) ?Referring Physician: Nevada Crane ?Primary Physician: Celene Squibb, MD ?Primary Cardiologist: New ?Reason for Consultation: CAD/CABG ? ?Active Problems: ?  * No active hospital problems. * ? ? ?HPI:  81 y.o. referred by Dr Nevada Crane for continuity of care with history of CABG.  Seen in ER  04/14/21 for HTN and started on  ?Lisnopril She has had Headaches with Lipitor and myalgias with crestor.  Last seen by Cardiology in 2019.  CABG 2014 Dr Servando Snare with LIMA to LAD, SVG D1 SVG distal LCX and SVG to RCA EF normal She is frail with chronic anxiety She has some chronic LE edema with venous dx. She has not wanted to be on PSK 9 in past ? ?*** ? ?ROS ?All other systems reviewed and negative except as noted above ? ?Past Medical History:  ?Diagnosis Date  ? Chest pain 10/03/2012  ? GERD (gastroesophageal reflux disease)   ? Reflux   ?  ?Family History  ?Problem Relation Age of Onset  ? Sudden death Father 28  ?  ?Social History  ? ?Socioeconomic History  ? Marital status: Married  ?  Spouse name: Not on file  ? Number of children: 3  ? Years of education: Not on file  ? Highest education level: Not on file  ?Occupational History  ? Not on file  ?Tobacco Use  ? Smoking status: Never  ? Smokeless tobacco: Never  ?Vaping Use  ? Vaping Use: Never used  ?Substance and Sexual Activity  ? Alcohol use: No  ? Drug use: No  ? Sexual activity: Not on file  ?Other Topics Concern  ? Not on file  ?Social History Narrative  ? Lives at home with husband  ? ?Social Determinants of Health  ? ?Financial Resource Strain: Not on file  ?Food Insecurity: Not on file  ?Transportation Needs: Not on file  ?Physical Activity: Not on file  ?Stress: Not on file  ?Social Connections: Not on file  ?Intimate Partner Violence: Not on file  ?  ?Past Surgical History:  ?Procedure Laterality Date  ? CORONARY ARTERY BYPASS GRAFT   10/09/2012  ? Procedure: CORONARY ARTERY BYPASS GRAFTING (CABG);  Surgeon: Grace Isaac, MD;  Location: [place];  Service: Open Heart Surgery;  Laterality: N/A;  times four using left internal artery and bilateral saphenous vein endoscopically harvested  ? INTRAOPERATIVE TRANSESOPHAGEAL ECHOCARDIOGRAM  10/09/2012  ? Procedure: INTRAOPERATIVE TRANSESOPHAGEAL ECHOCARDIOGRAM;  Surgeon: Grace Isaac, MD;  Location: [place];  Service: Open Heart Surgery;  Laterality: N/A;  ? LEFT HEART CATHETERIZATION WITH CORONARY ANGIOGRAM N/A 10/04/2012  ? Procedure: LEFT HEART CATHETERIZATION WITH CORONARY ANGIOGRAM;  Surgeon: Danah Reinecke M Martin[place], MD;  Location: [facility] CATH LAB;  Service: Cardiovascular;  Laterality: N/A;  ? NO PAST SURGERIES    ? None    ?  ? ? ?Current Outpatient Medications:  ?  aspirin 81 MG tablet, Take 81 mg by mouth daily., Disp: , Rfl:  ?  lisinopril (ZESTRIL) 5 MG tablet, Take 1 tablet (5 mg total) by mouth daily., Disp: 30 tablet, Rfl: 0 ? ? ? ?Physical Exam: ?There were no vitals taken for this visit.   ? ?Affect appropriate ?Healthy:  appears stated age ?HEENT: normal ?Neck supple with no adenopathy ?JVP normal no bruits no thyromegaly ?Lungs clear with no wheezing and good diaphragmatic motion ?Heart:  S1/S2 no murmur,  no rub, gallop or click ?PMI normal post sternotomy  ?Abdomen: benighn, BS positve, no tenderness, no AAA ?no bruit.  No HSM or HJR ?Distal pulses intact with no bruits ?No edema ?Neuro non-focal ?Skin warm and dry ?No muscular weakness ? ? ?Labs: ?  ?Lab Results  ?Component Value Date  ? WBC 8.5 10/12/2012  ? HGB 9.7 (L) 10/12/2012  ? HCT 29.8 (L) 10/12/2012  ? MCV 87.6 10/12/2012  ? PLT 143 (L) 10/12/2012  ? No results for input(s): NA, K, CL, CO2, BUN, CREATININE, CALCIUM, PROT, BILITOT, ALKPHOS, ALT, AST, GLUCOSE in the last 168 hours. ? ?Invalid input(s): LABALBU ?Lab Results  ?Component Value Date  ? TROPONINI <0.30 10/07/2012  ?  ?Lab Results  ?Component Value Date  ? CHOL 192 03/30/2016   ? CHOL 196 09/10/2014  ? CHOL 181 08/02/2013  ? ?Lab Results  ?Component Value Date  ? HDL 63 03/30/2016  ? HDL 44.50 09/10/2014  ? HDL 40.80 08/02/2013  ? ?Lab Results  ?Component Value Date  ? [place] 116 03/30/2016  ? LDLCALC 139 (H) 09/10/2014  ? LDLCALC 128 (H) 08/02/2013  ? ?Lab Results  ?Component Value Date  ? TRIG 67 03/30/2016  ? TRIG 65.0 09/10/2014  ? TRIG 59.0 08/02/2013  ? ?Lab Results  ?Component Value Date  ? CHOLHDL 3.0 03/30/2016  ? CHOLHDL 4 09/10/2014  ? CHOLHDL 4 08/02/2013  ? ?No results found for: LDLDIRECT  ?  ?Radiology: ?No results found. ? ?EKG: 2017 SR rate 69 normal *** ? ? ?ASSESSMENT AND PLAN:  ? ?CAD/CABG: done in 2014 see grafts noted above in HPI *** ?HLD:  refer back to lipid clinic for PSK9 intolerant to lipitor and Crestor in past Check lipid/liver ?HTN:  continue ACE and DASH type diet  ? ?Lipid/Liver ?Refer to Lipid Clinic PSK9 ? ?F/U cardiology in a year  ? ?Signed: ?Jenkins Rouge ?11/24/2021, 1:28 PM ? ? ?

## 2021-12-01 ENCOUNTER — Ambulatory Visit: Payer: PPO | Admitting: Cardiovascular Disease

## 2023-08-28 NOTE — Progress Notes (Signed)
CARDIOLOGY CONSULT NOTE       Patient ID: Abigail Solis MRN: [id number] DOB/AGE: 06/23/1941 82 y.o.  Admit date: (Not on file) Referring Physician: Leone Payor Primary Physician: Benita Stabile, MD Primary Cardiologist: New Reason for Consultation: CAD/CABG  Active Problems:   * No active hospital problems. *   HPI:  82 y.o. referred by Leone Payor for continuity of care. Prior CAD with CABG. Last seen by [place] in 2019. CABG Dr Tyrone Sage 2014 with LIMA to LAD, SVG to D1, SVG to LCX and SVG to RCA. EF normal Intolerant to statins. Some anxiety and does not like to take meds. PSK9 had been cost prohibitive in past on medicare has LE venous disease with venular dilatation and blueness to feet. She has been taking 81 mg ASA Started Rx for her HTN in 2022 with ACE.   Discussed starting PSK9 her LDL runs around 130 with no meds   She is active Her husband Earvin Hansen died last year CHF. I took care of him She has a large supportive Family that all live close to each other on [address]    ROS All other systems reviewed and negative except as noted above  Past Medical History:  Diagnosis Date   Chest pain 10/03/2012   GERD (gastroesophageal reflux disease)    Reflux     Family History  Problem Relation Age of Onset   Sudden death Father 35    Social History   Socioeconomic History   Marital status: Married    Spouse name: Not on file   Number of children: 3   Years of education: Not on file   Highest education level: Not on file  Occupational History   Not on file  Tobacco Use   Smoking status: Never   Smokeless tobacco: Never  Vaping Use   Vaping status: Never Used  Substance and Sexual Activity   Alcohol use: No   Drug use: No   Sexual activity: Not on file  Other Topics Concern   Not on file  Social History Narrative   Lives at home with husband   Social Determinants of Health   Financial Resource Strain: Not on file  Food Insecurity: Not on file   Transportation Needs: Not on file  Physical Activity: Not on file  Stress: Not on file  Social Connections: Not on file  Intimate Partner Violence: Not on file    Past Surgical History:  Procedure Laterality Date   CORONARY ARTERY BYPASS GRAFT  10/09/2012   Procedure: CORONARY ARTERY BYPASS GRAFTING (CABG);  Surgeon: Delight Ovens, MD;  Location: [facility];  Service: Open Heart Surgery;  Laterality: N/A;  times four using left internal artery and bilateral saphenous vein endoscopically harvested   INTRAOPERATIVE TRANSESOPHAGEAL ECHOCARDIOGRAM  10/09/2012   Procedure: INTRAOPERATIVE TRANSESOPHAGEAL ECHOCARDIOGRAM;  Surgeon: Delight Ovens, MD;  Location: [facility] OR;  Service: Open Heart Surgery;  Laterality: N/A;   LEFT HEART CATHETERIZATION WITH CORONARY ANGIOGRAM N/A 10/04/2012   Procedure: LEFT HEART CATHETERIZATION WITH CORONARY ANGIOGRAM;  Surgeon: Josslynn Mentzer M [name], MD;  Location[facility] CATH LAB;  Service: Cardiovascular;  Laterality: N/A;   NO PAST SURGERIES     None        Current Outpatient Medications:    aspirin 81 MG tablet, Take 81 mg by mouth daily., Disp: , Rfl:    lisinopril (ZESTRIL) 5 MG tablet, Take 1 tablet (5 mg total) by mouth daily., Disp: 30 tablet, Rfl: 0    Physical Exam:  Blood pressure 138/80, pulse 78, height 5' (1.524 m), weight 119 lb 6.4 oz (54.2 kg), SpO2 97%.    Affect appropriate Elderly female  HEENT: normal Neck supple with no adenopathy JVP normal no bruits no thyromegaly Lungs clear with no wheezing and good diaphragmatic motion Heart:  S1/S2 no murmur, no rub, gallop or click PMI normal prior sternotomy Abdomen: benighn, BS positve, no tenderness, no AAA no bruit.  No HSM or HJR Distal pulses intact with no bruits Trace pedal edema with venous dx Neuro non-focal Skin warm and dry No muscular weakness   Labs:   Lab Results  Component Value Date   WBC 8.5 10/12/2012   HGB 9.7 (L) 10/12/2012   HCT 29.8 (L) 10/12/2012   MCV 87.6 10/12/2012    PLT 143 (L) 10/12/2012   No results for input(s): "NA", "K", "CL", "CO2", "BUN", "CREATININE", "CALCIUM", "PROT", "BILITOT", "ALKPHOS", "ALT", "AST", "GLUCOSE" in the last 168 hours.  Invalid input(s): "LABALBU" Lab Results  Component Value Date   TROPONINI <0.30 10/07/2012    Lab Results  Component Value Date   CHOL 192 03/30/2016   CHOL 196 09/10/2014   CHOL 181 08/02/2013   Lab Results  Component Value Date   HDL 63 03/30/2016   HDL 44.50 09/10/2014   HDL 40.80 08/02/2013   Lab Results  Component Value Date   LDLCALC 116 03/30/2016   LDLCALC 139 (H) 09/10/2014   LDLCALC 128 (H) 08/02/2013   Lab Results  Component Value Date   TRIG 67 03/30/2016   TRIG 65.0 09/10/2014   TRIG 59.0 08/02/2013   Lab Results  Component Value Date   CHOLHDL 3.0 03/30/2016   CHOLHDL 4 09/10/2014   CHOLHDL 4 08/02/2013   No results found for: "LDLDIRECT"    Radiology: No results found.  EKG: SR LAD poor R wave progression no acute ST changes    ASSESSMENT AND PLAN:   CAD/CABG:  2014 with normal EF at that time update Ex myovue given age of grafts  HTN:  continue ACE and low sodium DASH diet HLD:  intolerant to statins historically LDL has been 115-140 refer lipid clinic for Nexlizet/PSK9   Ex myouve Lipid Clinic   F/U in a year   Signed: Charlton Haws 09/06/2023, 3:46 PM

## 2023-09-06 ENCOUNTER — Ambulatory Visit: Payer: PPO | Attending: Cardiovascular Disease | Admitting: Cardiovascular Disease

## 2023-09-06 ENCOUNTER — Encounter: Payer: Self-pay | Admitting: Cardiovascular Disease

## 2023-09-06 VITALS — BP 138/80 | HR 78 | Ht 60.0 in | Wt 119.4 lb

## 2023-09-06 DIAGNOSIS — Z951 Presence of aortocoronary bypass graft: Secondary | ICD-10-CM | POA: Diagnosis not present

## 2023-09-06 DIAGNOSIS — Z09 Encounter for follow-up examination after completed treatment for conditions other than malignant neoplasm: Secondary | ICD-10-CM | POA: Diagnosis not present

## 2023-09-06 DIAGNOSIS — I25708 Atherosclerosis of coronary artery bypass graft(s), unspecified, with other forms of angina pectoris: Secondary | ICD-10-CM | POA: Diagnosis not present

## 2023-09-06 DIAGNOSIS — E782 Mixed hyperlipidemia: Secondary | ICD-10-CM | POA: Diagnosis not present

## 2023-09-06 NOTE — Patient Instructions (Signed)
Medication Instructions:  Your physician recommends that you continue on your current medications as directed. Please refer to the Current Medication list given to you today.  *If you need a refill on your cardiac medications before your next appointment, please call your pharmacy*  Lab Work: If you have labs (blood work) drawn today and your tests are completely normal, you will receive your results only by: MyChart Message (if you have MyChart) OR A paper copy in the mail If you have any lab test that is abnormal or we need to change your treatment, we will call you to review the results.  Testing/Procedures: Your physician has requested that you have en exercise stress myoview. For further information please visit [email or web address] Please follow instruction sheet, as given.  Follow-Up: At [facility], you and your health needs are our priority.  As part of our continuing mission to provide you with exceptional heart care, we have created designated Provider Care Teams.  These Care Teams include your primary Cardiologist (physician) and Advanced Practice Providers (APPs -  Physician Assistants and Nurse Practitioners) who all work together to provide you with the care you need, when you need it.  We recommend signing up for the patient portal called "MyChart".  Sign up information is provided on this After Visit Summary.  MyChart is used to connect with patients for Virtual Visits (Telemedicine).  Patients are able to view lab/test results, encounter notes, upcoming appointments, etc.  Non-urgent messages can be sent to your provider as well.   To learn more about what you can do with MyChart, go to [email or web address].    Your next appointment:   1 year(s)  Provider:   Charlton Haws, MD     You have been referred to Pharmacy Lipid Clinic.

## 2023-09-13 ENCOUNTER — Telehealth (HOSPITAL_COMMUNITY): Payer: Self-pay | Admitting: Cardiovascular Disease

## 2023-09-13 NOTE — Telephone Encounter (Signed)
Patient cancelled Myoview scheduled for reason below:  09/13/2023 10:04 AM [id number], YVETTE  Cancel Rsn: Patient (pt states she this test wasn't supposed to be scheduled, she had disscused this with the provider.)   Order will be removed from the [facility] W[facility].Marland Kitchen Thank you

## 2023-09-22 ENCOUNTER — Encounter (HOSPITAL_COMMUNITY): Payer: PPO

## 2023-10-24 ENCOUNTER — Other Ambulatory Visit (HOSPITAL_COMMUNITY): Payer: Self-pay

## 2023-10-24 ENCOUNTER — Telehealth: Payer: Self-pay | Admitting: Pharmacist

## 2023-10-24 ENCOUNTER — Ambulatory Visit: Payer: PPO | Attending: Student | Admitting: Student

## 2023-10-24 ENCOUNTER — Telehealth: Payer: Self-pay | Admitting: Pharmacy Technician

## 2023-10-24 DIAGNOSIS — E782 Mixed hyperlipidemia: Secondary | ICD-10-CM | POA: Diagnosis not present

## 2023-10-24 MED ORDER — ROSUVASTATIN CALCIUM 10 MG PO TABS: 10.0000 mg | ORAL_TABLET | Freq: Every day | ORAL | 3 refills | Status: DC

## 2023-10-24 NOTE — Patient Instructions (Signed)
Your Results:             Your most recent labs Goal  Total Cholesterol 172 < 200  Triglycerides 38 < 150  HDL (happy/good cholesterol) 60 > 40  LDL (lousy/bad cholesterol 104 < 70   Medication changes: start taking Rosuvastatin 10 mg daily if not tolerated well reduce dose to 1/2 tab every day or every other day. Phone follow up end of Feb   Lab orders: We want to repeat labs after 2-3 months.  We will send you a lab order to remind you once we get closer to that time.

## 2023-10-24 NOTE — Telephone Encounter (Signed)
Both repatha and praluent needs a prior auth. I'm trying for repatha but stopping because its asking for labs within 120 days which would be after 06/26/23. She last got labs that I can see on 06/14/23 in [facility].     Key: [id number] saved until we get updated labs

## 2023-10-24 NOTE — Progress Notes (Unsigned)
Patient ID: Abigail Solis                 DOB: 07/31/1941                    MRN: [id number]      HPI: Abigail Solis is a 83 y.o. female patient referred to lipid clinic by Dr.Nishan. PMH is significant for hypertension, HLD, CAD s/p CABG, statin intolerance   Patient has hx of statin intolerance. Was on PCSK9i but it was cost prohibitive. Reviewed options for lowering LDL cholesterol, including ezetimibe, PCSK-9 inhibitors, bempedoic acid and inclisiran.  Discussed mechanisms of action, dosing, side effects and potential decreases in LDL cholesterol.  Also reviewed cost information and potential options for patient assistance.  Current Medications: none  Intolerances: Crestor 5 mg daily, Lipitor 80 mg, 40 mg, and 20 mg, Zetia 10 mg daily - myalgia  Risk Factors: hypertension, HLD, CAD s/p CABG, statin intolerance  LDL goal: <70  Last lab: 05/2023 LDLc 104, HDL 60, TG 38, TC 172   Diet:   Exercise: none   Family History:   Social History:  Alcohol: none  Smoking: none  Labs: Lipid Panel     Component Value Date/Time   CHOL 192 03/30/2016 0944   TRIG 67 03/30/2016 0944   HDL 63 03/30/2016 0944   CHOLHDL 3.0 03/30/2016 0944   VLDL 13 03/30/2016 0944   LDLCALC 116 03/30/2016 0944    Past Medical History:  Diagnosis Date   Chest pain 10/03/2012   GERD (gastroesophageal reflux disease)    Reflux     Current Outpatient Medications on File Prior to Visit  Medication Sig Dispense Refill   aspirin 81 MG tablet Take 81 mg by mouth daily.     lisinopril (ZESTRIL) 5 MG tablet Take 1 tablet (5 mg total) by mouth daily. 30 tablet 0   No current facility-administered medications on file prior to visit.    Allergies  Allergen Reactions   Lipitor [Atorvastatin] Other (See Comments)    HEADACHED   Nsaids Hives and Itching    blisters   Crestor [Rosuvastatin] Other (See Comments)    myalgias    Assessment/Plan:  1. Hyperlipidemia -  No problems updated. No  problem-specific Assessment & Plan notes found for this encounter.    Thank you,  Carmela Hurt, Pharm.D [facility] A Division of [facility] [address], [place], [place] [address]  Phone: [phone]; Fax: [phone]

## 2023-10-25 ENCOUNTER — Encounter: Payer: Self-pay | Admitting: Student

## 2023-10-25 NOTE — Assessment & Plan Note (Addendum)
Assessment:  LDL goal: < 70 mg/dl last LDLc 161 mg/dl (05/6044) Intolerance to statins and Zetia - Crestor 5 mg daily, Lipitor 80 mg, 40 mg, and 20 mg, Zetia 10 mg daily - myalgia  Discussed next potential options (PCSK-9 inhibitors, bempedoic acid and inclisiran); cost, dosing efficacy, side effects  Patient does not want to try any injectable therapy; want to retry Crestor   Plan:  start taking Rosuvastatin 10 mg daily if not tolerated well reduce dose to 1/2 tab every day or every other day. Phone follow up end of Feb by PharmD  In future can consider adding nexletol to max tolerated statin if LDLc still remains above goal  Lipid lab due in 2-3 months of therapy optimization

## 2023-11-02 ENCOUNTER — Telehealth: Payer: Self-pay | Admitting: Pharmacist

## 2023-11-02 MED ORDER — ROSUVASTATIN CALCIUM 5 MG PO TABS: 5.0000 mg | ORAL_TABLET | Freq: Every day | ORAL | 3 refills | Status: DC

## 2023-11-02 NOTE — Telephone Encounter (Signed)
 Patient called, states she can not tolerate 10 mg Creator due to stomach and muscle pain so would like to try 5 mg dose. Pills are hard to cut in half, requesting to send 5 mg prescription to her pharmacy. Prescription sent for 5 mg Crestor . Follow up via phone due end of Feb. Plan is to add nexletol. Patient refuse to be on any injectables.

## 2023-11-10 NOTE — Telephone Encounter (Signed)
Pt called and reports she developed rash from Crestor 5 mg. It's itchy rash all across her chest. She thinks it is from Crestor so advised to hold Crestor for 2 weeks and see if rash improves. Will call back in 2 weeks for follow up.

## 2023-12-13 DIAGNOSIS — I10 Essential (primary) hypertension: Secondary | ICD-10-CM | POA: Diagnosis not present

## 2023-12-20 DIAGNOSIS — E785 Hyperlipidemia, unspecified: Secondary | ICD-10-CM | POA: Diagnosis not present

## 2023-12-20 DIAGNOSIS — E875 Hyperkalemia: Secondary | ICD-10-CM | POA: Diagnosis not present

## 2023-12-20 DIAGNOSIS — F419 Anxiety disorder, unspecified: Secondary | ICD-10-CM | POA: Diagnosis not present

## 2023-12-20 DIAGNOSIS — Z0001 Encounter for general adult medical examination with abnormal findings: Secondary | ICD-10-CM | POA: Diagnosis not present

## 2023-12-20 DIAGNOSIS — I10 Essential (primary) hypertension: Secondary | ICD-10-CM | POA: Diagnosis not present

## 2023-12-20 DIAGNOSIS — Z951 Presence of aortocoronary bypass graft: Secondary | ICD-10-CM | POA: Diagnosis not present

## 2023-12-20 DIAGNOSIS — G720 Drug-induced myopathy: Secondary | ICD-10-CM | POA: Diagnosis not present

## 2023-12-27 NOTE — Telephone Encounter (Signed)
 Pt called, rashes has completely gone. It took couple months. Discussed benefits of statins. Willing to try pravastatin but want to think about it. Not willing to try any injectables. Will call back later this week.

## 2023-12-27 NOTE — Addendum Note (Signed)
 Addended by: Tylene Fantasia on: 12/27/2023 02:17 PM   Modules accepted: Orders

## 2024-03-26 DIAGNOSIS — H04123 Dry eye syndrome of bilateral lacrimal glands: Secondary | ICD-10-CM | POA: Diagnosis not present

## 2024-06-13 DIAGNOSIS — Z951 Presence of aortocoronary bypass graft: Secondary | ICD-10-CM | POA: Diagnosis not present

## 2024-06-20 DIAGNOSIS — G720 Drug-induced myopathy: Secondary | ICD-10-CM | POA: Diagnosis not present

## 2024-06-20 DIAGNOSIS — E785 Hyperlipidemia, unspecified: Secondary | ICD-10-CM | POA: Diagnosis not present

## 2024-06-20 DIAGNOSIS — F419 Anxiety disorder, unspecified: Secondary | ICD-10-CM | POA: Diagnosis not present

## 2024-06-20 DIAGNOSIS — I10 Essential (primary) hypertension: Secondary | ICD-10-CM | POA: Diagnosis not present

## 2024-06-20 DIAGNOSIS — T466X5A Adverse effect of antihyperlipidemic and antiarteriosclerotic drugs, initial encounter: Secondary | ICD-10-CM | POA: Diagnosis not present

## 2024-06-20 DIAGNOSIS — Z951 Presence of aortocoronary bypass graft: Secondary | ICD-10-CM | POA: Diagnosis not present

## 2024-06-20 DIAGNOSIS — E875 Hyperkalemia: Secondary | ICD-10-CM | POA: Diagnosis not present

## 2024-06-26 ENCOUNTER — Telehealth: Payer: Self-pay | Admitting: Cardiovascular Disease

## 2024-06-26 NOTE — Telephone Encounter (Signed)
 Pt c/o medication issue:  1. Name of Medication: Repatha  2. How are you currently taking this medication (dosage and times per day)?     3. Are you having a reaction (difficulty breathing--STAT)?   4. What is your medication issue?    Patient wants a call back to discuss her cholesterol medication.

## 2024-06-27 ENCOUNTER — Other Ambulatory Visit (HOSPITAL_COMMUNITY): Payer: Self-pay

## 2024-06-27 ENCOUNTER — Telehealth: Payer: Self-pay | Admitting: Pharmacy Technician

## 2024-06-27 NOTE — Telephone Encounter (Signed)
 Will be seeing patient on 07/04/2024 for lipid follow up.

## 2024-06-27 NOTE — Telephone Encounter (Signed)
   Pharmacy Patient Advocate Encounter   Received notification from Pt Calls Messages that prior authorization for repatha is required/requested.   Insurance verification completed.   The patient is insured through [facility] ADVANTAGE/RX ADVANCE.   Per test claim: PA required; PA submitted to above mentioned insurance via Latent Key/confirmation #/EOC [id number] Status is pending

## 2024-06-27 NOTE — Telephone Encounter (Signed)
 Pharmacy Patient Advocate Encounter  Received notification from [employer] that Prior Authorization for Repatha has been APPROVED from 06/27/24 to 12/24/24. Ran test claim, Copay is $47.00. This test claim was processed through [facility]- copay amounts may vary at other pharmacies due to pharmacy/plan contracts, or as the patient moves through the different stages of their insurance plan.

## 2024-07-04 ENCOUNTER — Ambulatory Visit: Attending: Cardiovascular Disease | Admitting: Pharmacist

## 2024-07-04 ENCOUNTER — Other Ambulatory Visit (HOSPITAL_COMMUNITY): Payer: Self-pay

## 2024-07-04 ENCOUNTER — Encounter: Payer: Self-pay | Admitting: Pharmacist

## 2024-07-04 ENCOUNTER — Telehealth: Payer: Self-pay | Admitting: Pharmacy Technician

## 2024-07-04 DIAGNOSIS — R7303 Prediabetes: Secondary | ICD-10-CM | POA: Diagnosis not present

## 2024-07-04 DIAGNOSIS — E782 Mixed hyperlipidemia: Secondary | ICD-10-CM | POA: Diagnosis not present

## 2024-07-04 MED ORDER — REPATHA SURECLICK 140 MG/ML ~~LOC~~ SOAJ
140.0000 mg | SUBCUTANEOUS | 3 refills | Status: AC
  Filled 2024-07-04: qty 6, 84d supply, fill #0
  Filled 2024-09-02 – 2024-09-05 (×2): qty 6, 84d supply, fill #1

## 2024-07-04 NOTE — Assessment & Plan Note (Signed)
 Assessment:  LDL goal: < 70 mg/dl last LDLc 895 mg/dl (90/7975) Intolerance to statins and Zetia  - Crestor  5 mg daily, Lipitor  80 mg, 40 mg, and 20 mg, Zetia  10 mg daily - myalgia  Discussed next potential options (PCSK-9 inhibitors, bempedoic acid and inclisiran); cost, dosing efficacy, side effects  Reiterated importance of healthy diet and regular exercise  Patient enrolled in the [facility] grant   Plan: Start taking Repatha 140 mg every 14 days under the skin  Follow up lipid lab due Jan 2026

## 2024-07-04 NOTE — Progress Notes (Signed)
 Patient ID: Abigail Solis                 DOB: 18-Mar-1941                    MRN: [id number]      HPI: Abigail Solis is a 83 y.o. female patient referred to lipid clinic by Dr.Nishan. PMH is significant for hypertension, HLD, CAD s/p CABG, statin intolerance   Patient saw me Jan 2025 for lipid management. Pt was reluctant to go on any injectable therapy despite being intolerance history on Crestor  and  atorvastatin  she decided to try Crestor  low dose which she could not tolerate due to myalgia  Returning to lipid clinic for follow up with her daughter today. She is willing to try Repatha that we discussed last time. PA has been approved and she is requesting grant - enrolled her in the grant.  Current Medications: Repatha 140 mg every 14 days ( started today on 07/04/2024)  Intolerances: Crestor  5 mg daily, Lipitor  80 mg, 40 mg, and 20 mg, Zetia  10 mg daily - myalgia  Risk Factors: hypertension, HLD, CAD s/p CABG, statin intolerance  LDL goal: <70  Last lab: 05/2023 LDLc 104, HDL 60, TG 38, TC 172   Diet: needs improvement, she will work on reducing fat and salt intake   Exercise: none   Family History:  Relation Problem Comments  Mother (Deceased)   Father (Deceased) Sudden death (Age: 65)     Social History:  Alcohol: none  Smoking: none  Labs: Lipid Panel     Component Value Date/Time   CHOL 192 03/30/2016 0944   TRIG 67 03/30/2016 0944   HDL 63 03/30/2016 0944   CHOLHDL 3.0 03/30/2016 0944   VLDL 13 03/30/2016 0944   LDLCALC 116 03/30/2016 0944    Past Medical History:  Diagnosis Date   Chest pain 10/03/2012   GERD (gastroesophageal reflux disease)    Reflux     Current Outpatient Medications on File Prior to Visit  Medication Sig Dispense Refill   aspirin  81 MG tablet Take 81 mg by mouth daily.     lisinopril  (ZESTRIL ) 5 MG tablet Take 1 tablet (5 mg total) by mouth daily. 30 tablet 0   No current facility-administered medications on file prior to  visit.    Allergies  Allergen Reactions   Lipitor  [Atorvastatin ] Other (See Comments)    HEADACHED   Nsaids Hives and Itching    blisters   Crestor  [Rosuvastatin ] Other (See Comments)    myalgias    Assessment/Plan:  1. Hyperlipidemia -  Problem  Mixed Hyperlipidemia   Current Medications: Repatha 140 mg every 14 days   Intolerances: Crestor  5 mg daily, Lipitor  80 mg, 40 mg, and 20 mg, Zetia  10 mg daily - myalgia  Risk Factors: hypertension, HLD, CAD s/p CABG, statin intolerance  LDL goal: <70  Last lab: 05/2023 LDLc 104, HDL 60, TG 38, TC 172      Mixed hyperlipidemia Assessment:  LDL goal: < 70 mg/dl last LDLc 895 mg/dl (90/7975) Intolerance to statins and Zetia  - Crestor  5 mg daily, Lipitor  80 mg, 40 mg, and 20 mg, Zetia  10 mg daily - myalgia  Discussed next potential options (PCSK-9 inhibitors, bempedoic acid and inclisiran); cost, dosing efficacy, side effects  Reiterated importance of healthy diet and regular exercise  Patient enrolled in the [facility] grant   Plan: Start taking Repatha 140 mg every 14 days under the skin  Follow up  lipid lab due Jan 2026     Thank you,  Robbi Blanch, Pharm.D [facility] A Division of [facility] [address], [place], [place] [address]  Phone: [phone]; Fax: [phone]

## 2024-07-04 NOTE — Telephone Encounter (Signed)
 Patient Advocate Encounter   The patient was approved for a [employer] grant that will help cover the cost of repatha Total amount awarded, 2500.  Effective: 06/04/24 - 06/03/25   APW:[id number] ERW:[id number] Hmnle:[id number] PI:[id number] [employer] ID: [id number]   Pharmacy provided with approval and processing information. Patient informed via mychart

## 2024-07-08 ENCOUNTER — Telehealth: Payer: Self-pay | Admitting: Pharmacist

## 2024-07-08 MED ORDER — REPATHA SURECLICK 140 MG/ML ~~LOC~~ SOAJ: 140.0000 mg | SUBCUTANEOUS | 0 refills | Status: AC

## 2024-07-08 NOTE — Telephone Encounter (Signed)
 Pt c/o medication issue:  1. Name of Medication:   Evolocumab (REPATHA SURECLICK) 140 MG/ML SOAJ   2. How are you currently taking this medication (dosage and times per day)?   3. Are you having a reaction (difficulty breathing--STAT)?   4. What is your medication issue?   Caller Adolph) stated patient reported a defective injection device and he is calling to get verbal approval to send out a replacement.

## 2024-07-08 NOTE — Telephone Encounter (Signed)
 The patient's daughter called to report that the first Repatha pen was wasted due to a faulty device. She contacted the support number listed on the box (1-800 number) and was provided with a case number: [id number]. A prescription for a replacement dose has been sent to [facility].

## 2024-08-21 DIAGNOSIS — I10 Essential (primary) hypertension: Secondary | ICD-10-CM | POA: Diagnosis not present

## 2024-08-28 DIAGNOSIS — I10 Essential (primary) hypertension: Secondary | ICD-10-CM | POA: Diagnosis not present

## 2024-09-02 ENCOUNTER — Other Ambulatory Visit (HOSPITAL_COMMUNITY): Payer: Self-pay

## 2024-10-07 ENCOUNTER — Encounter: Payer: Self-pay | Admitting: Pharmacist

## 2024-10-07 ENCOUNTER — Other Ambulatory Visit (HOSPITAL_COMMUNITY): Payer: Self-pay

## 2024-10-07 ENCOUNTER — Ambulatory Visit: Attending: Cardiology | Admitting: Pharmacist

## 2024-10-07 ENCOUNTER — Telehealth: Payer: Self-pay | Admitting: Student

## 2024-10-07 VITALS — BP 171/89 | HR 79

## 2024-10-07 DIAGNOSIS — E782 Mixed hyperlipidemia: Secondary | ICD-10-CM

## 2024-10-07 DIAGNOSIS — I10 Essential (primary) hypertension: Secondary | ICD-10-CM

## 2024-10-07 LAB — LIPID PANEL
Chol/HDL Ratio: 2.1 ratio (ref 0.0–4.4)
Cholesterol, Total: 132 mg/dL (ref 100–199)
HDL: 62 mg/dL
LDL Chol Calc (NIH): 59 mg/dL (ref 0–99)
Triglycerides: 50 mg/dL (ref 0–149)
VLDL Cholesterol Cal: 11 mg/dL (ref 5–40)

## 2024-10-07 MED ORDER — LISINOPRIL 10 MG PO TABS: 10.0000 mg | ORAL_TABLET | Freq: Every day | ORAL | 3 refills | Status: DC

## 2024-10-07 NOTE — Assessment & Plan Note (Addendum)
 Assessment: BP is uncontrolled in office BP 171/89 mmHg above the goal (<130/80). Tolerates Lisinopril  well without any side effects Home BP is occasionally elevated, she reports elevations are often associated with headache and feelings of weakness She reports taking a second dose of lisinopril  when blood pressure is elevated, which helps lower readings back to goal Denies SOB, palpitation, chest pain, headaches,or swelling today  The patient is hesitant to initiate a second antihypertensive medication (hydrochlorothiazide) and prefers to increase the lisinopril  dose at this time The importance of continued lifestyle modifications, including regular exercise and adherence to a low-sodium diet, was reinforced.  Plan:  Increase Lisinopril  dose to 10 mg in the morning  Patient to keep record of BP readings with heart rate and report to us  at the next visit Follow up lab(s) in 2 weeks (10/16/2024) to assess kidney function and potassium (BMP).   Patient to see PharmD in 2 and half weeks for follow up for BP management (10/21/2024)

## 2024-10-07 NOTE — Progress Notes (Signed)
 Patient ID: Abigail Solis                 DOB: 17-Jul-1941                      MRN: [id number]      HPI: Abigail Solis is a 84 y.o. female referred by Dr. Norleen Hurst to HTN clinic. PMH is significant for HTN, HLD, CAD s/p CABG  The patient has been taking lisinopril  5 mg each morning for the past four years. Recently, she has noted elevated evening blood pressure readings in the 160s/80s. When her blood pressure is elevated, she reports associated headache and feelings of weakness. In response, she has been taking an additional dose of lisinopril , which she reports lowers her blood pressure back into the normal range once but the second time it did not help.    She also reports increased anxiety when her blood pressure is elevated, which may further contribute to difficulty achieving blood pressure control.  The patients blood pressure monitoring technique was assessed and found to be appropriate. She reports eliminating added salt from her diet and has begun making healthier dietary choices.  Given persistently elevated blood pressure readings at home and in the office, initiation of a second antihypertensive medication was recommended. However, the patient expressed hesitancy about starting an additional agent, specifically hydrochlorothiazide. Due to this concern, the plan is to increase lisinopril  to 10 mg daily and reassess blood pressure control at follow-up in two weeks to determine whether further adjustments are necessary.  Current HTN meds: Lisinopril  5 mg daily in morning  Previously tried:  Lisionpril 10 mg (hypotension)  BP goal: <140/80  Family History:  Relation Problem Comments  Mother (Deceased)   Father (Deceased) Sudden death (Age: 61)     Social History:  Alcohol: No Smoking: No    Diet: Does not eat out, eliminated salt from food, very minimal red meat B: bagel, toast  L/D: Sandwiches, pasta, veggies, chicken  Drinks: 24 oz of water, occasional soda,  coffee in mornings   Exercise: Minimal walking  Home BP readings: Patient did not bring in home log but reported a few elevated readings including: 179/84, 165/85   Wt Readings from Last 3 Encounters:  09/06/23 119 lb 6.4 oz (54.2 kg)  02/15/18 104 lb (47.2 kg)  10/27/16 115 lb 12.8 oz (52.5 kg)   BP Readings from Last 3 Encounters:  10/07/24 (!) 171/89  09/06/23 138/80  04/14/21 (!) 171/77   Pulse Readings from Last 3 Encounters:  10/07/24 79  09/06/23 78  04/14/21 80    Renal function: CrCl cannot be calculated (Patient's most recent lab result is older than the maximum 21 days allowed.).  Past Medical History:  Diagnosis Date   Chest pain 10/03/2012   GERD (gastroesophageal reflux disease)    Reflux     Medications Ordered Prior to Encounter[1]  Allergies[2]  Blood pressure (!) 171/89, pulse 79.   Assessment/Plan:  1. Hypertension -  Problem  Hypertension   Hypertension Assessment: BP is uncontrolled in office BP 171/89 mmHg above the goal (<130/80). Tolerates Lisinopril  well without any side effects Home BP is occasionally elevated, she reports elevations are often associated with headache and feelings of weakness She reports taking a second dose of lisinopril  when blood pressure is elevated, which helps lower readings back to goal Denies SOB, palpitation, chest pain, headaches,or swelling today  The patient is hesitant to initiate a second antihypertensive medication (hydrochlorothiazide)  and prefers to increase the lisinopril  dose at this time The importance of continued lifestyle modifications, including regular exercise and adherence to a low-sodium diet, was reinforced.  Plan:  Increase Lisinopril  dose to 10 mg in the morning  Patient to keep record of BP readings with heart rate and report to us  at the next visit Follow up lab(s) in 2 weeks (10/16/2024) to assess kidney function and potassium (BMP).   Patient to see PharmD in 2 and half weeks for  follow up for BP management (10/21/2024)     Thank you, Zunaira Afsar PharmD Candidate   Azaliah Carrero, Pharm.D [facility] [address], [place], [place] [address] Phone: [phone]; Fax: [phone]       [1]  Current Outpatient Medications on File Prior to Visit  Medication Sig Dispense Refill   aspirin  81 MG tablet Take 81 mg by mouth daily.     Evolocumab  (REPATHA  SURECLICK) 140 MG/ML SOAJ Inject 140 mg into the skin every 14 (fourteen) days. 6 mL 3   Evolocumab  (REPATHA  SURECLICK) 140 MG/ML SOAJ Inject 140 mg into the skin every 14 (fourteen) days. Replacement of faulty device case # [id number] 1 mL 0   No current facility-administered medications on file prior to visit.  [2]  Allergies Allergen Reactions   Lipitor  [Atorvastatin ] Other (See Comments)    HEADACHED   Nsaids Hives and Itching    blisters   Crestor  [Rosuvastatin ] Other (See Comments)    myalgias

## 2024-10-08 ENCOUNTER — Ambulatory Visit: Payer: Self-pay | Admitting: Cardiovascular Disease

## 2024-10-16 ENCOUNTER — Ambulatory Visit: Attending: Cardiology | Admitting: Pharmacist

## 2024-10-16 ENCOUNTER — Encounter: Payer: Self-pay | Admitting: Pharmacist

## 2024-10-16 VITALS — BP 134/78 | HR 80

## 2024-10-16 DIAGNOSIS — I10 Essential (primary) hypertension: Secondary | ICD-10-CM

## 2024-10-16 LAB — BASIC METABOLIC PANEL WITH GFR
BUN/Creatinine Ratio: 16 (ref 12–28)
BUN: 11 mg/dL (ref 8–27)
CO2: 21 mmol/L (ref 20–29)
Calcium: 9.4 mg/dL (ref 8.7–10.3)
Chloride: 99 mmol/L (ref 96–106)
Creatinine, Ser: 0.68 mg/dL (ref 0.57–1.00)
Glucose: 92 mg/dL (ref 70–99)
Potassium: 4.7 mmol/L (ref 3.5–5.2)
Sodium: 135 mmol/L (ref 134–144)
eGFR: 86 mL/min/1.73

## 2024-10-16 LAB — HEMOGLOBIN A1C
Est. average glucose Bld gHb Est-mCnc: 117 mg/dL
Hgb A1c MFr Bld: 5.7 % — ABNORMAL HIGH (ref 4.8–5.6)

## 2024-10-16 NOTE — Progress Notes (Signed)
 Patient ID: Abigail Solis                 DOB: May 03, 1941                      MRN: [id number]      HPI: Abigail Solis is a 84 y.o. female referred by Dr. Norleen Hurst to HTN clinic. PMH is significant for HTN, HLD, CAD s/p CABG  The patient has been taking lisinopril  5 mg each morning for the past four years. Recently, she has noted elevated evening BP readings in the 160s/80s. When her BP is elevated, she reports associated headache and feelings of weakness. In response, she has been taking an additional dose of lisinopril , which she reports lowers her BP back into the normal range once, but the second time it did not help. Given persistently elevated BP readings at home and in the office, initiation of hydrochlorothiazide  was recommended. However, the patient expressed hesitancy about starting an additional agent. Due to this concern, the plan is to increase lisinopril  to 10 mg daily and reassess BP control at follow-up in two weeks   Today at the follow up, the patient reports that her BP is still elevated the last couple days. She also reports increased anxiety when her blood pressure is elevated, which may further contribute to difficulty achieving blood pressure control. Given the elevated readings, the patient is willing to start a 2nd antihypertensive medication. However, she has concerns about the possibility of her BP dropping too low, therefore it was reiterated addition of this medication is unlikely to cause excessive hypotension. She was advised to contact the office if she experiences persistently low readings (<100/60 mmHg).  Patient did complete a BMP lab today, therefore pending normal results she will add on hydrochlorothiazide . If lab is not WNL, we will start amlodipine instead. Patient is agreeable to this plan.      The patient's blood pressure monitoring technique was assessed as well and found to be appropriate. She also brought in her BP machine to be validated and was  found to be within 10 points mmHg. She reports continuing to eliminate added salt from her diet.    Current HTN meds: Lisinopril  10 mg daily in morning  Previously tried:  Lisionpril 10 mg (hypotension)  BP goal: <140/80  Family History:  Relation Problem Comments  Mother (Deceased)   Father (Deceased) Sudden death (Age: 58)     Social History:  Alcohol: No Smoking: No    Diet: Does not eat out, eliminated salt from food, very minimal red meat B: bagel, toast  L/D: Sandwiches, pasta, veggies, chicken  Drinks: 24 oz of water, occasional soda, coffee in mornings   Exercise: Minimal walking  Home BP readings:  Systolic: ranges from 130 to 150 (highest 182 mmHg)  Diastolic: ranges from 70-80 (highest 85 mmHg)    Wt Readings from Last 3 Encounters:  09/06/23 119 lb 6.4 oz (54.2 kg)  02/15/18 104 lb (47.2 kg)  10/27/16 115 lb 12.8 oz (52.5 kg)   BP Readings from Last 3 Encounters:  10/16/24 134/78  10/07/24 (!) 171/89  09/06/23 138/80   Pulse Readings from Last 3 Encounters:  10/16/24 80  10/07/24 79  09/06/23 78    Renal function: CrCl cannot be calculated (Patient's most recent lab result is older than the maximum 21 days allowed.).  Past Medical History:  Diagnosis Date   Chest pain 10/03/2012   GERD (gastroesophageal reflux disease)  Reflux     Medications Ordered Prior to Encounter[1]  Allergies[2]  Blood pressure 134/78, pulse 80.   Assessment/Plan:  1. Hypertension -  Problem  Hypertension    Hypertension Assessment: BP is controlled in office BP 134/ 78 mmHg at the goal (<140/80) At home systolic BP not at goal (ranges from 130-150, highest 182 mmHg)  Home BP monitor validated today and is found to be accurate  Tolerates Lisinopril  10mg  well without any side effects Denies SOB, palpitation, chest pain, headaches,or swelling Reiterated the importance of regular exercise and low salt diet   Plan:  Pending BMP labs WNL, start taking  lisinopril /hydrochlorothiazide  10/12.5mg  daily and stop taking lisinopril  10mg  daily  If BMP labs are not WNL, start taking amlodipine 2.5mg  daily and continue taking lisinopril  10mg   Patient to keep record of BP readings with heart rate and report to us  at the next visit Patient to call if her BP is persistently below 100/60 mmHg  Patient to see PharmD in 3 weeks for follow up  Follow up lab(s) in 4 weeks if hydrochlorothiazide  started    Thank you, Zunaira Afsar PharmD Candidate   Zebulan Hinshaw, Pharm.D  [facility] [address], [place], [place] [address] Phone: [phone]; Fax: [phone]       [1]  Current Outpatient Medications on File Prior to Visit  Medication Sig Dispense Refill   aspirin  81 MG tablet Take 81 mg by mouth daily.     Evolocumab  (REPATHA  SURECLICK) 140 MG/ML SOAJ Inject 140 mg into the skin every 14 (fourteen) days. 6 mL 3   Evolocumab  (REPATHA  SURECLICK) 140 MG/ML SOAJ Inject 140 mg into the skin every 14 (fourteen) days. Replacement of faulty device case # [id number] 1 mL 0   lisinopril  (ZESTRIL ) 10 MG tablet Take 1 tablet (10 mg total) by mouth daily. 90 tablet 3   No current facility-administered medications on file prior to visit.  [2]  Allergies Allergen Reactions   Lipitor  [Atorvastatin ] Other (See Comments)    HEADACHED   Nsaids Hives and Itching    blisters   Crestor  [Rosuvastatin ] Other (See Comments)    myalgias

## 2024-10-16 NOTE — Assessment & Plan Note (Addendum)
 Assessment: BP is controlled in office BP 134/ 78 mmHg at the goal (<140/80) At home systolic BP not at goal (ranges from 130-150, highest 182 mmHg)  Home BP monitor validated today and is found to be accurate  Tolerates Lisinopril  10mg  well without any side effects Denies SOB, palpitation, chest pain, headaches,or swelling Reiterated the importance of regular exercise and low salt diet   Plan:  Pending BMP labs WNL, start taking lisinopril /hydrochlorothiazide  10/12.5mg  daily and stop taking lisinopril  10mg  daily  If BMP labs are not WNL, start taking amlodipine 2.5mg  daily and continue taking lisinopril  10mg   Patient to keep record of BP readings with heart rate and report to us  at the next visit Patient to call if her BP is persistently below 100/60 mmHg  Patient to see PharmD in 3 weeks for follow up  Follow up lab(s) in 4 weeks if hydrochlorothiazide  started

## 2024-10-17 ENCOUNTER — Ambulatory Visit: Payer: Self-pay | Admitting: Pharmacist

## 2024-10-17 LAB — LIPID PANEL
Chol/HDL Ratio: 2.1 ratio (ref 0.0–4.4)
Cholesterol, Total: 153 mg/dL (ref 100–199)
HDL: 74 mg/dL
LDL Chol Calc (NIH): 66 mg/dL (ref 0–99)
Triglycerides: 66 mg/dL (ref 0–149)
VLDL Cholesterol Cal: 13 mg/dL (ref 5–40)

## 2024-10-17 MED ORDER — LISINOPRIL-HYDROCHLOROTHIAZIDE 10-12.5 MG PO TABS: 1.0000 | ORAL_TABLET | Freq: Every day | ORAL | 3 refills | Status: AC

## 2024-10-17 NOTE — Telephone Encounter (Signed)
 Patient called back and want to go on thiazide diuretics - will add to her current lisinopril  and switch to combination pill.

## 2024-10-17 NOTE — Addendum Note (Signed)
 Addended by: Quyen Cutsforth K on: 10/17/2024 01:09 PM   Modules accepted: Orders

## 2024-10-17 NOTE — Telephone Encounter (Signed)
 Lab result discussed- patient states her BP last night at goal so does not want to add thiazide or CCB to her ACEi she will wait till next visit in 3 weeks. LDL at goal so will continue taking Repatha .

## 2024-10-21 ENCOUNTER — Ambulatory Visit: Admitting: Pharmacist

## 2024-10-29 ENCOUNTER — Telehealth: Payer: Self-pay | Admitting: Emergency Medicine

## 2024-10-29 DIAGNOSIS — I10 Essential (primary) hypertension: Secondary | ICD-10-CM

## 2024-10-30 NOTE — Telephone Encounter (Signed)
 Spoke to patient, we will get BMP at 02/18 visit

## 2024-10-31 NOTE — Addendum Note (Signed)
 Addended by: Dearra Myhand K on: 10/31/2024 07:44 AM   Modules accepted: Orders

## 2024-11-01 ENCOUNTER — Ambulatory Visit: Payer: Self-pay | Admitting: Pharmacist

## 2024-11-01 LAB — BASIC METABOLIC PANEL WITH GFR
BUN/Creatinine Ratio: 23 (ref 12–28)
BUN: 16 mg/dL (ref 8–27)
CO2: 19 mmol/L — ABNORMAL LOW (ref 20–29)
Calcium: 9.2 mg/dL (ref 8.7–10.3)
Chloride: 93 mmol/L — ABNORMAL LOW (ref 96–106)
Creatinine, Ser: 0.71 mg/dL (ref 0.57–1.00)
Glucose: 90 mg/dL (ref 70–99)
Potassium: 5.1 mmol/L (ref 3.5–5.2)
Sodium: 127 mmol/L — ABNORMAL LOW (ref 134–144)
eGFR: 84 mL/min/{1.73_m2}

## 2024-11-01 NOTE — Telephone Encounter (Signed)
 Result discussed over the phone, patient states she was fighting some form of vial infection - hd sx mild diarrhea, lower extremities pain) which now has improved. Her BP at goal after adding hydrochlorothiazide  to lisinopril . Advise to add some salt to her food will repeat BMP in 1 week. Patient does not want to change meds as this combination is working well to control her BP. Has appointment with me on Feb 18, will repeat lab on Feb 18.

## 2024-11-13 ENCOUNTER — Ambulatory Visit: Admitting: Pharmacist

## 2024-11-21 ENCOUNTER — Ambulatory Visit: Admitting: Cardiovascular Disease
